# Patient Record
Sex: Female | Born: 1950 | Race: White | Hispanic: No | Marital: Married | Smoking: Never smoker
Health system: Southern US, Community
[De-identification: ages and names within clinical notes are randomized; demographics above are authoritative.]

## PROBLEM LIST (undated history)

## (undated) DIAGNOSIS — Z86718 Personal history of other venous thrombosis and embolism: Secondary | ICD-10-CM

## (undated) DIAGNOSIS — N1831 Chronic kidney disease, stage 3a: Secondary | ICD-10-CM

## (undated) DIAGNOSIS — T7840XA Allergy, unspecified, initial encounter: Secondary | ICD-10-CM

## (undated) DIAGNOSIS — E039 Hypothyroidism, unspecified: Secondary | ICD-10-CM

## (undated) DIAGNOSIS — E785 Hyperlipidemia, unspecified: Secondary | ICD-10-CM

## (undated) DIAGNOSIS — M1990 Unspecified osteoarthritis, unspecified site: Secondary | ICD-10-CM

## (undated) DIAGNOSIS — R001 Bradycardia, unspecified: Secondary | ICD-10-CM

## (undated) HISTORY — PX: CHOLECYSTECTOMY: SHX55

## (undated) HISTORY — PX: VARICOSE VEIN SURGERY: SHX832

## (undated) HISTORY — DX: Allergy, unspecified, initial encounter: T78.40XA

## (undated) HISTORY — PX: OTHER SURGICAL HISTORY: SHX169

---

## 2007-11-28 ENCOUNTER — Ambulatory Visit: Payer: Self-pay | Admitting: Unknown Physician Specialty

## 2010-08-05 ENCOUNTER — Ambulatory Visit: Payer: Self-pay | Admitting: Internal Medicine

## 2012-01-06 ENCOUNTER — Ambulatory Visit: Payer: Self-pay

## 2012-03-03 ENCOUNTER — Ambulatory Visit: Payer: Self-pay | Admitting: Internal Medicine

## 2012-03-03 LAB — CBC WITH DIFFERENTIAL/PLATELET
Basophil #: 0.1 10*3/uL (ref 0.0–0.1)
Basophil %: 1.1 %
Eosinophil #: 0.1 10*3/uL (ref 0.0–0.7)
HCT: 41 % (ref 35.0–47.0)
HGB: 13.7 g/dL (ref 12.0–16.0)
Lymphocyte #: 1.1 10*3/uL (ref 1.0–3.6)
Lymphocyte %: 16.6 %
MCHC: 33.5 g/dL (ref 32.0–36.0)
MCV: 92 fL (ref 80–100)
Neutrophil #: 4.4 10*3/uL (ref 1.4–6.5)
Platelet: 278 10*3/uL (ref 150–440)
RDW: 14 % (ref 11.5–14.5)

## 2012-03-03 LAB — URINALYSIS, COMPLETE
Blood: NEGATIVE
Glucose,UR: NEGATIVE mg/dL (ref 0–75)
Nitrite: NEGATIVE
Ph: 6.5 (ref 4.5–8.0)
Specific Gravity: 1.015 (ref 1.003–1.030)

## 2012-03-03 LAB — COMPREHENSIVE METABOLIC PANEL
BUN: 12 mg/dL (ref 7–18)
Bilirubin,Total: 0.5 mg/dL (ref 0.2–1.0)
Calcium, Total: 8.9 mg/dL (ref 8.5–10.1)
Chloride: 101 mmol/L (ref 98–107)
Co2: 30 mmol/L (ref 21–32)
Creatinine: 0.94 mg/dL (ref 0.60–1.30)
Potassium: 3.5 mmol/L (ref 3.5–5.1)
SGPT (ALT): 23 U/L (ref 12–78)
Sodium: 140 mmol/L (ref 136–145)
Total Protein: 7.9 g/dL (ref 6.4–8.2)

## 2012-03-03 LAB — SEDIMENTATION RATE: Erythrocyte Sed Rate: 29 mm/hr (ref 0–30)

## 2012-03-03 LAB — AMYLASE: Amylase: 60 U/L (ref 25–115)

## 2012-03-04 LAB — URINE CULTURE

## 2012-05-02 ENCOUNTER — Ambulatory Visit: Payer: Self-pay | Admitting: Internal Medicine

## 2012-05-05 ENCOUNTER — Other Ambulatory Visit: Payer: Self-pay | Admitting: Surgery

## 2012-05-05 LAB — CBC WITH DIFFERENTIAL/PLATELET
HCT: 37.6 % (ref 35.0–47.0)
HGB: 12.3 g/dL (ref 12.0–16.0)
Lymphocyte #: 1.2 10*3/uL (ref 1.0–3.6)
Lymphocyte %: 18.2 %
MCHC: 32.7 g/dL (ref 32.0–36.0)
Monocyte #: 0.6 x10 3/mm (ref 0.2–0.9)
Monocyte %: 9 %
Neutrophil %: 67.8 %
Platelet: 341 10*3/uL (ref 150–440)
RDW: 15.1 % — ABNORMAL HIGH (ref 11.5–14.5)
WBC: 6.6 10*3/uL (ref 3.6–11.0)

## 2012-05-24 ENCOUNTER — Ambulatory Visit: Payer: Self-pay | Admitting: Unknown Physician Specialty

## 2013-01-04 ENCOUNTER — Emergency Department: Payer: Self-pay | Admitting: Emergency Medicine

## 2013-01-04 LAB — CBC
HGB: 13.8 g/dL (ref 12.0–16.0)
RDW: 13.5 % (ref 11.5–14.5)

## 2013-01-04 LAB — BASIC METABOLIC PANEL
Calcium, Total: 8.9 mg/dL (ref 8.5–10.1)
Co2: 28 mmol/L (ref 21–32)
Creatinine: 1 mg/dL (ref 0.60–1.30)
EGFR (African American): >60

## 2013-01-04 LAB — TROPONIN I: Troponin-I: <0.02 ng/mL

## 2013-01-05 LAB — CK TOTAL AND CKMB (NOT AT ARMC): CK-MB: 0.7 ng/mL (ref 0.5–3.6)

## 2013-01-05 LAB — TROPONIN I: Troponin-I: <0.02 ng/mL

## 2013-01-12 ENCOUNTER — Ambulatory Visit: Payer: Self-pay | Admitting: Cardiology

## 2013-04-24 ENCOUNTER — Ambulatory Visit: Payer: Self-pay | Admitting: Unknown Physician Specialty

## 2013-05-04 DIAGNOSIS — N321 Vesicointestinal fistula: Secondary | ICD-10-CM | POA: Insufficient documentation

## 2013-05-04 DIAGNOSIS — K5732 Diverticulitis of large intestine without perforation or abscess without bleeding: Secondary | ICD-10-CM | POA: Insufficient documentation

## 2013-06-12 DIAGNOSIS — J90 Pleural effusion, not elsewhere classified: Secondary | ICD-10-CM | POA: Insufficient documentation

## 2013-06-12 DIAGNOSIS — IMO0002 Reserved for concepts with insufficient information to code with codable children: Secondary | ICD-10-CM | POA: Insufficient documentation

## 2013-06-12 DIAGNOSIS — K651 Peritoneal abscess: Secondary | ICD-10-CM | POA: Insufficient documentation

## 2014-02-08 ENCOUNTER — Ambulatory Visit: Payer: Self-pay | Admitting: Physician Assistant

## 2014-07-04 ENCOUNTER — Ambulatory Visit
Admit: 2014-07-04 | Disposition: A | Discharge status: Home / Self Care | Payer: Self-pay | Attending: Family Medicine | Admitting: Family Medicine

## 2014-07-11 ENCOUNTER — Other Ambulatory Visit
Admit: 2014-07-11 | Disposition: A | Discharge status: Home / Self Care | Payer: Self-pay | Attending: Unknown Physician Specialty | Admitting: Unknown Physician Specialty

## 2014-07-11 LAB — CLOSTRIDIUM DIFFICILE(ARMC)

## 2014-07-14 LAB — STOOL CULTURE

## 2014-08-04 IMAGING — US ABDOMEN ULTRASOUND LIMITED
1 series · 14 of 25 positions shown · non-contrast
Comparison: none

REASON FOR EXAM: RUQ pain  radiates to back    GALLBLADDER
COMMENTS:

[Series 1: abdomen ultrasound limited · 0.20mm/px · 14 of 76 slices shown]
[im 1/76]
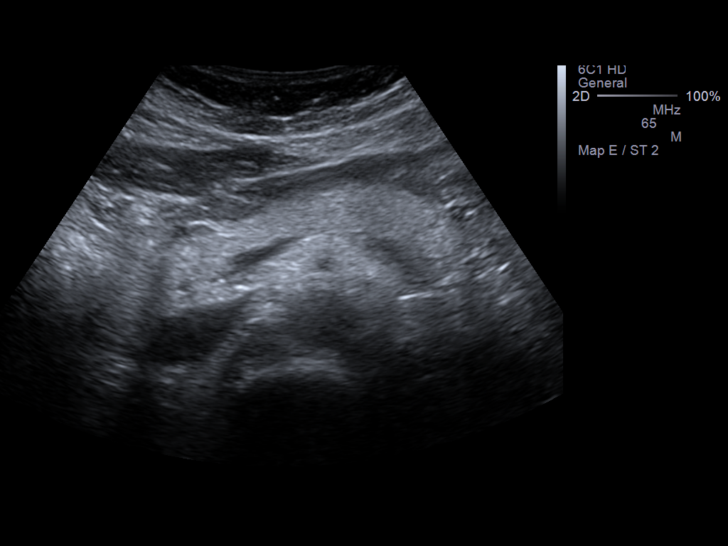
[im 7/76]
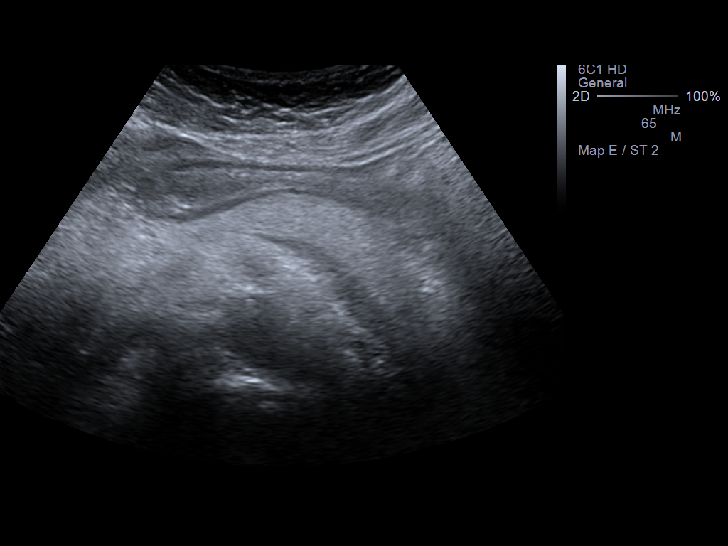
[im 13/76]
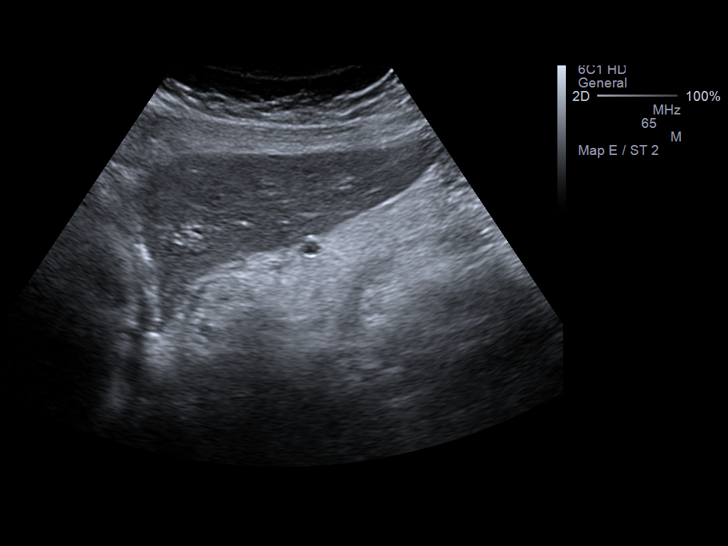
[im 19/76]
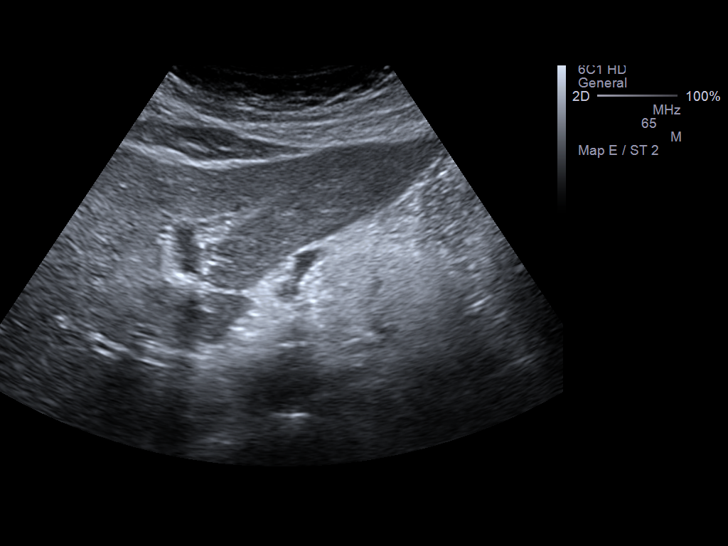
[im 26/76]
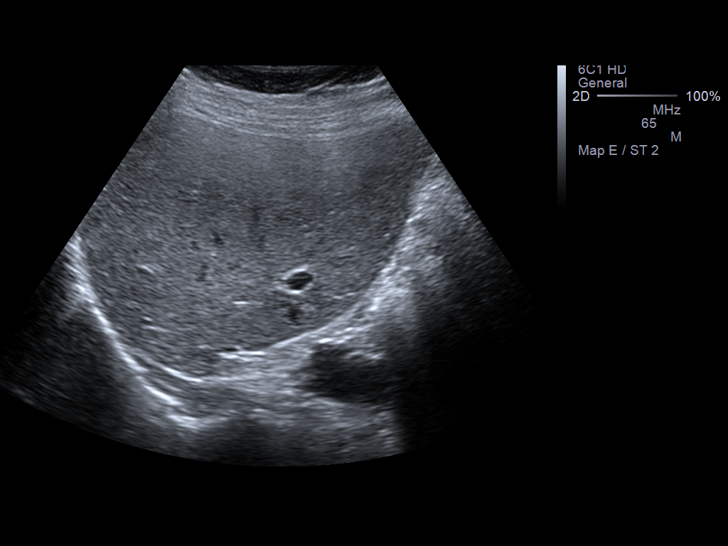
[im 29/76]
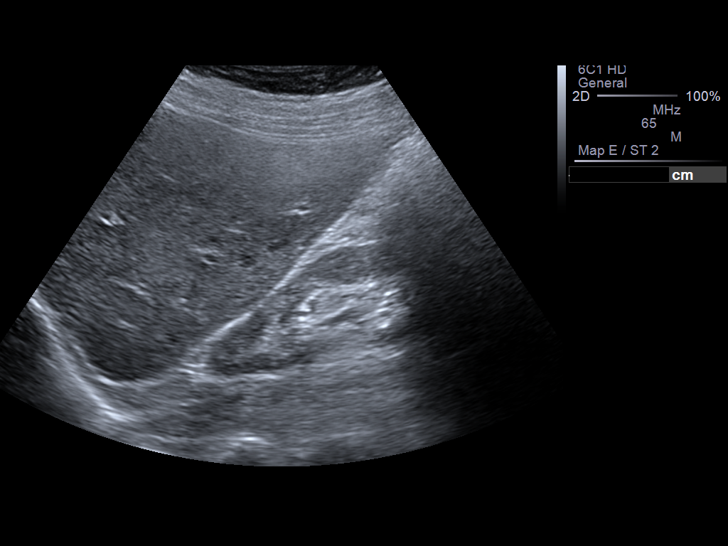
[im 35/76]
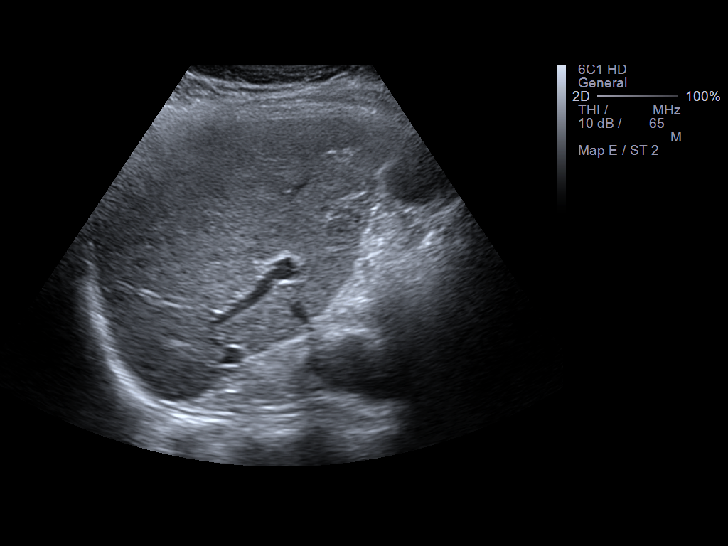
[im 41/76]
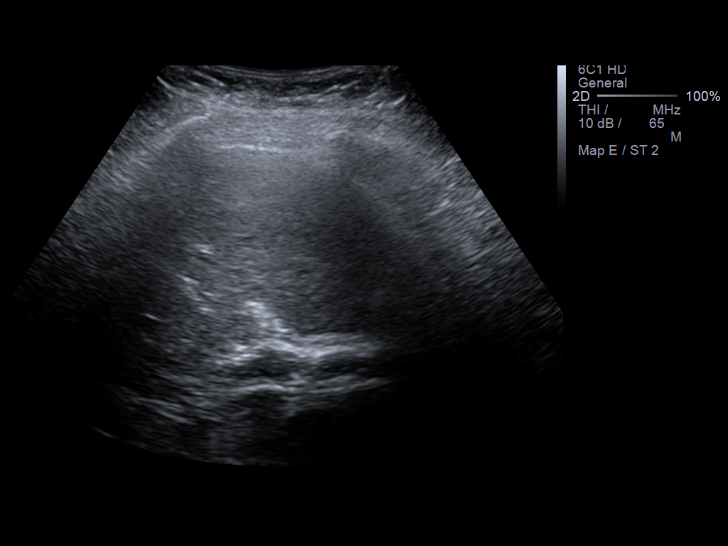
[im 47/76]
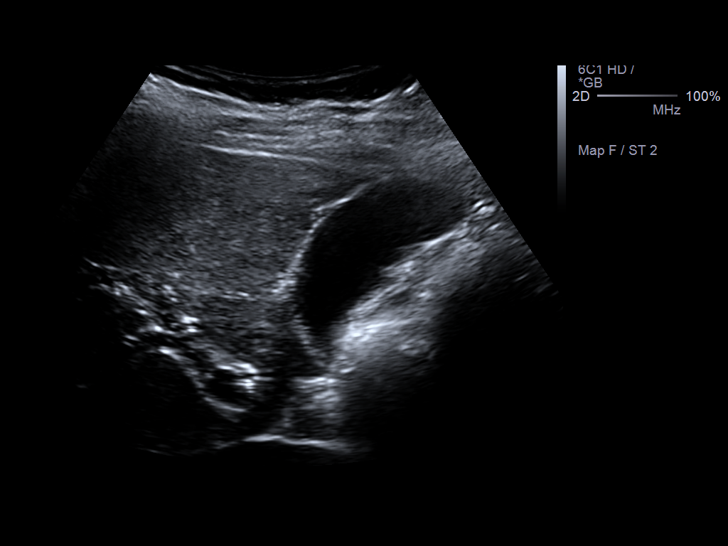
[im 51/76]
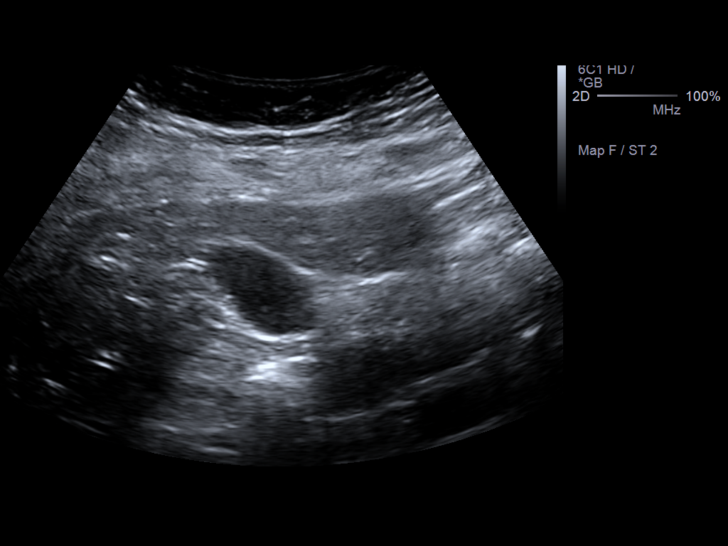
[im 57/76]
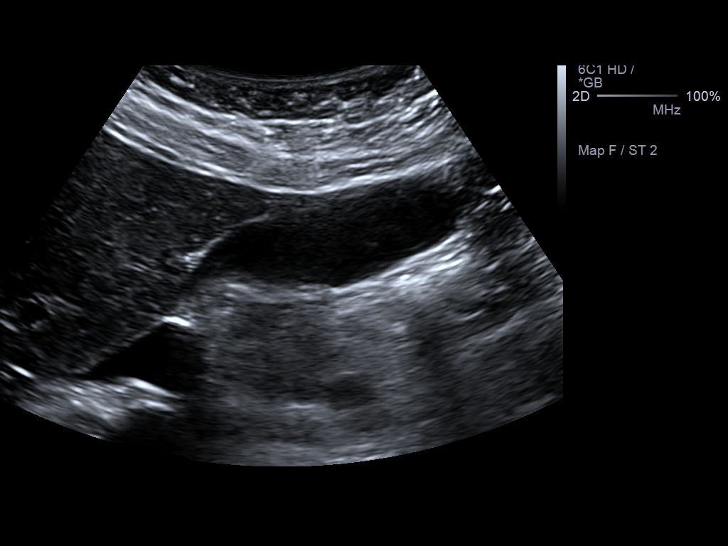
[im 63/76]
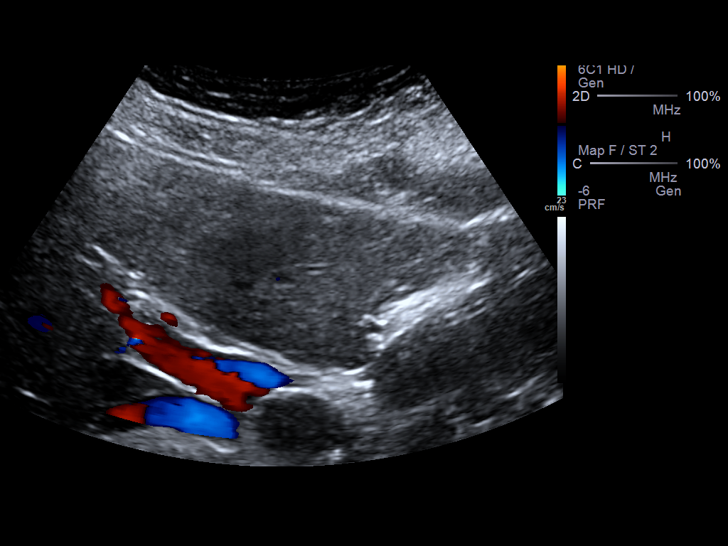
[im 69/76]
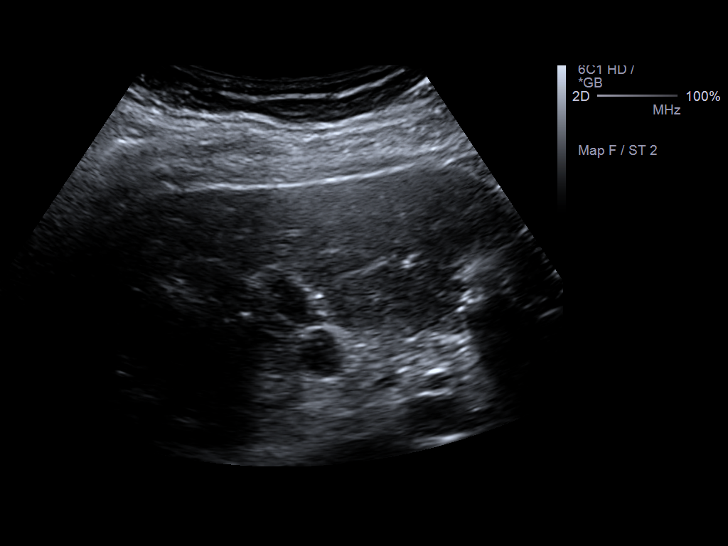
[im 76/76]
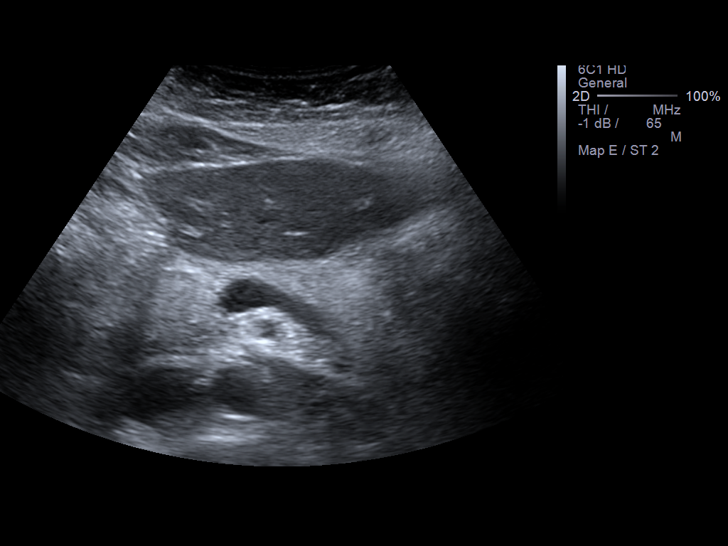

[14 of 25 positions shown; findings below may reference images not displayed]

PROCEDURE:     US  - US ABDOMEN LIMITED SURVEY  - January 12, 2013  [DATE]

RESULT:     A limited upper abdominal ultrasound examination is reviewed.

The liver exhibits normal echotexture with no focal mass nor ductal
dilation. Portal venous flow is normal in direction toward the liver. The
pancreas demonstrates normal contour and echotexture with no focal mass or
ductal dilation. The gallbladder is adequately distended with no evidence of
stones, wall thickening, or pericholecystic fluid. There is no positive
sonographic Murphy's sign. The common bile duct measures 3.5 mm in diameter.
IMPRESSION: Normal limited upper abdominal ultrasound examination.
Specifically there is no evidence of acute cholecystitis or cholelithiasis.

[REDACTED]

## 2014-09-13 ENCOUNTER — Telehealth: Payer: Self-pay

## 2014-09-13 ENCOUNTER — Ambulatory Visit (INDEPENDENT_AMBULATORY_CARE_PROVIDER_SITE_OTHER): Payer: No Typology Code available for payment source | Admitting: Family Medicine

## 2014-09-13 ENCOUNTER — Encounter (INDEPENDENT_AMBULATORY_CARE_PROVIDER_SITE_OTHER): Payer: Self-pay

## 2014-09-13 ENCOUNTER — Encounter: Payer: Self-pay | Admitting: Family Medicine

## 2014-09-13 VITALS — BP 130/80 | HR 63 | Ht 64.0 in | Wt 174.4 lb

## 2014-09-13 DIAGNOSIS — Z87898 Personal history of other specified conditions: Secondary | ICD-10-CM | POA: Insufficient documentation

## 2014-09-13 DIAGNOSIS — N904 Leukoplakia of vulva: Secondary | ICD-10-CM | POA: Diagnosis not present

## 2014-09-13 DIAGNOSIS — R700 Elevated erythrocyte sedimentation rate: Secondary | ICD-10-CM | POA: Diagnosis not present

## 2014-09-13 DIAGNOSIS — E663 Overweight: Secondary | ICD-10-CM | POA: Diagnosis not present

## 2014-09-13 DIAGNOSIS — J302 Other seasonal allergic rhinitis: Secondary | ICD-10-CM | POA: Diagnosis not present

## 2014-09-13 DIAGNOSIS — Z8719 Personal history of other diseases of the digestive system: Secondary | ICD-10-CM | POA: Insufficient documentation

## 2014-09-13 NOTE — Telephone Encounter (Signed)
-----   Message from Adline Potter, MD sent at 09/13/2014 10:56 AM EDT ----- Regarding: pt call Please inform pt I reviewed her lab work from April and before and do recommend she have a lipid panel and vit D level checked. Let me know if she agrees and I will order.

## 2014-09-13 NOTE — Progress Notes (Signed)
Date:  09/13/2014   Name:  Madeline Green   DOB:  1951/03/07   MRN:  [id number]  PCP:  Adline Potter, MD    Chief Complaint: Establish Care and Sinusitis   History of Present Illness:  This is a 64 y.o. female with hx diverticulitis with abscess last year s/p surgery at [facility], no sequela, followed by [facility]. Had negative cardiac w/u last year as well. Currently c/o sinus pressure on and off past 3 months with PND, sneezing, watery eyes. Hx "hay fever" but never lasted this long. Sxs improve with OTC antihistamine but recur off med. Nasal saline also seems to help. Hx lichen sclerosis for which she has used high potency steroids in past (unsure of name). Tetanus status unknown, thinks she may have gotten one before surgery. Had blood work done 06/2014 when dx'd with UTI (sxs resolved, not recurrent). Had one mammogram when young, not interested in another.  Review of Systems:  Review of Systems  Constitutional: Negative for unexpected weight change.  HENT: Negative for ear pain and sore throat.   Eyes: Negative for pain.  Respiratory: Negative for shortness of breath.   Cardiovascular: Negative for chest pain and leg swelling.  Gastrointestinal: Negative for abdominal pain, diarrhea, constipation, blood in stool and abdominal distention.  Genitourinary: Negative for dysuria and difficulty urinating.  Musculoskeletal: Negative for arthralgias and gait problem.  Skin: Negative for rash.  Neurological: Negative for dizziness and syncope.  Hematological: Negative for adenopathy.  Psychiatric/Behavioral: Negative for sleep disturbance.    Patient Active Problem List   Diagnosis Date Noted  . Overweight (BMI 25.0-29.9) 09/13/2014  . Seasonal allergic rhinitis 09/13/2014  . Lichen sclerosus of female genitalia 09/13/2014  . Hx of diverticulitis of colon 09/13/2014  . Hx of abdominal abscess 09/13/2014    Prior to Admission medications   Not on File    Allergies  Allergen Reactions   . Iodine Itching and Rash  . Sulfa Antibiotics Nausea Only    Past Surgical History  Procedure Laterality Date  . Cholecystectomy    . Varicose vein surgery Left     History  Substance Use Topics  . Smoking status: Never Smoker   . Smokeless tobacco: Not on file  . Alcohol Use: 0.0 oz/week    0 Standard drinks or equivalent per week    Family History  Problem Relation Age of Onset  . Alcohol abuse Mother   . Hypertension Mother   . Stroke Mother   . Varicose Veins Mother   . Alcohol abuse Father   . Heart disease Father     Medication list has been reviewed and updated.  Physical Examination: BP 130/80 mmHg  Pulse 63  Ht 5\' 4"  (1.626 m)  Wt 174 lb 6.4 oz (79.107 kg)  BMI 29.92 kg/m2  Physical Exam  Constitutional: She is oriented to person, place, and time. She appears well-developed and well-nourished. No distress.  HENT:  Head: Normocephalic and atraumatic.  Right Ear: External ear normal.  Left Ear: External ear normal.  Nose: Nose normal.  Mouth/Throat: Oropharynx is clear and moist. No oropharyngeal exudate.  Eyes: Conjunctivae and EOM are normal. Pupils are equal, round, and reactive to light. Right eye exhibits no discharge. Left eye exhibits no discharge. No scleral icterus.  Neck: Neck supple. No thyromegaly present.  Cardiovascular: Normal rate, regular rhythm, normal heart sounds and intact distal pulses.  Exam reveals no gallop.   No murmur heard. Pulmonary/Chest: Effort normal and breath sounds normal. No  respiratory distress. She has no wheezes. She has no rales.  Abdominal: Soft. She exhibits no distension and no mass. There is no tenderness.  Musculoskeletal: She exhibits no edema.  Lymphadenopathy:    She has no cervical adenopathy.  Neurological: She is alert and oriented to person, place, and time. Coordination normal.  Skin: Skin is warm and dry. No rash noted.  Psychiatric: She has a normal mood and affect. Her behavior is normal.     Assessment and Plan:  1. Overweight (BMI 25.0-29.9) Discussed increased exercise, weight loss  2. Seasonal allergic rhinitis Recommend trial of OTC second generation antihistamine, consider adding steroid NS if ineffective.  3. Lichen sclerosus of female genitalia Consider high potency steroid refill if OTC emolliants ineffective  4. Elevated sed rate On recent labs, RMSF IgM negative, no clear etiology, monitor  5. Hx of diverticulitis of colon  6. Hx of abdominal abscess With fistula s/p resection 2015  7. Health maintenance Consider lipid profile and vit D level  Return in about 6 months (around 03/15/2015).  Satira Anis. [facility]  09/13/2014

## 2014-09-13 NOTE — Telephone Encounter (Signed)
Left Pt voicemail to call me back about bloodwork

## 2014-09-13 NOTE — Telephone Encounter (Signed)
Spoke with Pt,she would like to talk to her insurance carrier before having blood work

## 2014-12-06 ENCOUNTER — Encounter: Payer: Self-pay | Admitting: Emergency Medicine

## 2014-12-06 ENCOUNTER — Ambulatory Visit
Admission: EM | Admit: 2014-12-06 | Discharge: 2014-12-06 | Disposition: A | Discharge status: Home / Self Care | Payer: No Typology Code available for payment source | Attending: Family Medicine | Admitting: Family Medicine

## 2014-12-06 DIAGNOSIS — N390 Urinary tract infection, site not specified: Secondary | ICD-10-CM | POA: Diagnosis not present

## 2014-12-06 LAB — URINALYSIS COMPLETE WITH MICROSCOPIC (ARMC ONLY)
Bilirubin Urine: NEGATIVE
Glucose, UA: NEGATIVE mg/dL
KETONES UR: NEGATIVE mg/dL
NITRITE: NEGATIVE
PH: 6.5 (ref 5.0–8.0)
PROTEIN: NEGATIVE mg/dL
SPECIFIC GRAVITY, URINE: 1.025 (ref 1.005–1.030)

## 2014-12-06 MED ORDER — CIPROFLOXACIN HCL 500 MG PO TABS: 500.0000 mg | ORAL_TABLET | Freq: Two times a day (BID) | ORAL | Status: DC

## 2014-12-06 NOTE — ED Provider Notes (Signed)
CSN: [id number]     Arrival date & time 12/06/14  1550 History   First MD Initiated Contact with Patient 12/06/14 1714     Chief Complaint  Patient presents with  . Urinary Frequency   (Consider location/radiation/quality/duration/timing/severity/associated sxs/prior Treatment) Patient is a 64 y.o. female presenting with frequency. The history is provided by the patient. No language interpreter was used.  Urinary Frequency This is a new (Started on Monday or Tuesday this week) problem. The problem occurs constantly. The problem has not changed since onset.Pertinent negatives include no chest pain, no abdominal pain, no headaches and no shortness of breath. Nothing aggravates the symptoms. She has tried nothing for the symptoms.   Patient does not smoke. And had colon surgery last year```````````````````````````````````````````````````````````````````````````````````````````````````````````````````````````````````````````````````````````````````````````````````````````````````````````````````````````````````````````````````````````````````````````````````````````````````````````````````````````````````````````````````````````````````````````````````````````````````````````````````````````````````````````````````````````````````````````````````````````````````````````````````````````````````````````````````` Past Medical History  Diagnosis Date  . Allergy    Past Surgical History  Procedure Laterality Date  . Cholecystectomy    . Varicose vein surgery Left    Family History  Problem Relation Age of Onset  . Alcohol abuse Mother   . Hypertension Mother   . Stroke Mother   . Varicose Veins Mother   . Alcohol abuse Father   . Heart disease Father   . Diabetes Father    Social History  Substance Use Topics  . Smoking status: Never Smoker   . Smokeless tobacco: None  . Alcohol Use: 0.0 oz/week    0 Standard drinks or equivalent per week   OB History    No data available     Review  of Systems  Respiratory: Negative for shortness of breath.   Cardiovascular: Negative for chest pain.  Gastrointestinal: Negative for abdominal pain.  Genitourinary: Positive for frequency. Negative for hematuria, decreased urine volume, vaginal bleeding, vaginal discharge and dyspareunia.  Neurological: Negative for headaches.  All other systems reviewed and are negative.   Allergies  Iodine and Sulfa antibiotics  Home Medications   Prior to Admission medications   Medication Sig Start Date End Date Taking? Authorizing Provider  ciprofloxacin (CIPRO) 500 MG tablet Take 1 tablet (500 mg total) by mouth 2 (two) times daily. 12/06/14   Frederich Cha, MD   Meds Ordered and Administered this Visit  Medications - No data to display  BP 141/86 mmHg  Pulse 61  Temp(Src) 98.8 F (37.1 C)  Resp 16  Ht 5\' 5"  (1.651 m)  Wt 170 lb (77.111 kg)  BMI 28.29 kg/m2  SpO2 99% No data found.   Physical Exam  Constitutional: She is oriented to person, place, and time. She appears well-developed and well-nourished.  HENT:  Head: Normocephalic and atraumatic.  Abdominal: Soft. Bowel sounds are normal. There is no hepatosplenomegaly. There is no tenderness. There is no CVA tenderness.  Musculoskeletal: Normal range of motion.  Neurological: She is alert and oriented to person, place, and time.  Skin: Skin is warm and dry.  Psychiatric: She has a normal mood and affect. Her behavior is normal.  Vitals reviewed.   ED Course  Procedures (including critical care time)  Labs Review Labs Reviewed  URINALYSIS COMPLETEWITH MICROSCOPIC (Narcissa) - Abnormal; Notable for the following:    Hgb urine dipstick TRACE (*)    Leukocytes, UA TRACE (*)    Bacteria, UA FEW (*)    Squamous Epithelial / LPF 0-5 (*)    All other components within normal limits  URINE CULTURE    Imaging Review No results found.   Visual Acuity Review  Right Eye Distance:   Left Eye Distance:   Bilateral  Distance:     Right Eye Near:   Left Eye Near:    Bilateral Near:         MDM   1. UTI (lower urinary tract infection)    patient denies the need for Pyridium. States she does get yeast infections. We'll place on Cipro 504 week 1 tablet twice a day while PCP as needed   Frederich Cha, MD 12/06/14 [phone]

## 2014-12-06 NOTE — Discharge Instructions (Signed)

## 2014-12-06 NOTE — ED Notes (Signed)
Urinary tract symptoms for 2 days

## 2014-12-08 LAB — URINE CULTURE: SPECIAL REQUESTS: NORMAL

## 2015-07-24 ENCOUNTER — Ambulatory Visit (INDEPENDENT_AMBULATORY_CARE_PROVIDER_SITE_OTHER): Payer: Medicare Other | Admitting: Family Medicine

## 2015-07-24 ENCOUNTER — Encounter: Payer: Self-pay | Admitting: Family Medicine

## 2015-07-24 VITALS — BP 106/68 | HR 72 | Temp 98.3°F | Ht 65.0 in | Wt 172.0 lb

## 2015-07-24 DIAGNOSIS — E663 Overweight: Secondary | ICD-10-CM

## 2015-07-24 DIAGNOSIS — K5732 Diverticulitis of large intestine without perforation or abscess without bleeding: Secondary | ICD-10-CM | POA: Diagnosis not present

## 2015-07-24 MED ORDER — CIPROFLOXACIN HCL 500 MG PO TABS: 500.0000 mg | ORAL_TABLET | Freq: Two times a day (BID) | ORAL | Status: DC

## 2015-07-24 MED ORDER — METRONIDAZOLE 500 MG PO TABS: 500.0000 mg | ORAL_TABLET | Freq: Two times a day (BID) | ORAL | Status: DC

## 2015-07-24 NOTE — Progress Notes (Signed)
Date:  07/24/2015   Name:  Madeline Green   DOB:  Apr 19, 1950   MRN:  [id number]  PCP:  Adline Potter, MD    Chief Complaint: Abdominal Pain   History of Present Illness:  This is a 65 y.o. female with hx diverticulitis s/p surgical drainage of abscess 2 yrs ago presents with 3d hx LLQ abdominal pain worse with eating and not improving on liquid diet.   Review of Systems:  Review of Systems  Constitutional: Negative for fever.  Respiratory: Negative for shortness of breath.   Cardiovascular: Negative for chest pain.  Gastrointestinal: Negative for nausea, vomiting, diarrhea, blood in stool and abdominal distention.  Genitourinary: Negative for dysuria.  Neurological: Negative for syncope and light-headedness.    Patient Active Problem List   Diagnosis Date Noted  . Overweight (BMI 25.0-29.9) 09/13/2014  . Seasonal allergic rhinitis 09/13/2014  . Lichen sclerosus of female genitalia 09/13/2014  . Hx of diverticulitis of colon 09/13/2014  . Hx of abdominal abscess 09/13/2014    Prior to Admission medications   Medication Sig Start Date End Date Taking? Authorizing Provider  ciprofloxacin (CIPRO) 500 MG tablet Take 1 tablet (500 mg total) by mouth 2 (two) times daily. 07/24/15   Adline Potter, MD  metroNIDAZOLE (FLAGYL) 500 MG tablet Take 1 tablet (500 mg total) by mouth 2 (two) times daily. 07/24/15   Adline Potter, MD    Allergies  Allergen Reactions  . Iodine Itching and Rash  . Sulfa Antibiotics Nausea Only    Past Surgical History  Procedure Laterality Date  . Cholecystectomy    . Varicose vein surgery Left     Social History  Substance Use Topics  . Smoking status: Never Smoker   . Smokeless tobacco: None  . Alcohol Use: 0.0 oz/week    0 Standard drinks or equivalent per week    Family History  Problem Relation Age of Onset  . Alcohol abuse Mother   . Hypertension Mother   . Stroke Mother   . Varicose Veins Mother   . Alcohol abuse Father   . Heart  disease Father   . Diabetes Father     Medication list has been reviewed and updated.  Physical Examination: BP 106/68 mmHg  Pulse 72  Temp(Src) 98.3 F (36.8 C) (Oral)  Ht 5\' 5"  (1.651 m)  Wt 172 lb (78.019 kg)  BMI 28.62 kg/m2  Physical Exam  Constitutional: She appears well-developed and well-nourished. No distress.  Cardiovascular: Normal rate, regular rhythm and normal heart sounds.   Pulmonary/Chest: Effort normal and breath sounds normal.  Abdominal: Soft. She exhibits no distension and no mass. There is no rebound and no guarding.  Minimal LLQ tenderness  Musculoskeletal: She exhibits no edema.  Neurological: She is alert.  Skin: Skin is warm and dry.  Psychiatric: She has a normal mood and affect. Her behavior is normal.  Nursing note and vitals reviewed.   Assessment and Plan:  1. Diverticulitis of large intestine without perforation or abscess without bleeding May resolve spontaneously but will cover with abxs (Cipro and Flagyl) given hx, call if sxs worsen/persist  2. Overweight (BMI 25.0-29.9) Consider lipids/vit D level next visit  Return in about 3 months (around 10/23/2015).  Satira Anis. [facility]  07/24/2015

## 2015-10-30 ENCOUNTER — Encounter: Payer: Self-pay | Admitting: Family Medicine

## 2015-10-30 ENCOUNTER — Ambulatory Visit (INDEPENDENT_AMBULATORY_CARE_PROVIDER_SITE_OTHER): Payer: Commercial Managed Care - HMO | Admitting: Family Medicine

## 2015-10-30 VITALS — BP 120/80 | HR 60 | Ht 65.0 in | Wt 173.0 lb

## 2015-10-30 DIAGNOSIS — Z0000 Encounter for general adult medical examination without abnormal findings: Secondary | ICD-10-CM | POA: Diagnosis not present

## 2015-10-30 NOTE — Progress Notes (Signed)
Patient: Madeline Green, Female    DOB: September 22, 1950, 65 y.o.   MRN: [id number] Visit Date: 10/30/2015  Today's Provider: Otilio Miu, MD   Chief Complaint  Patient presents with  . Medicare Wellness   Subjective:   Initial preventative physical exam Madeline Green is a 65 y.o. female who presents today for her Initial Preventative Physical Exam. She feels well. She reports exercising almost every day. She reports she is sleeping well.  HPI  Review of Systems  Constitutional: Negative.  Negative for chills, fatigue, fever and unexpected weight change.  HENT: Negative for congestion, ear discharge, ear pain, rhinorrhea, sinus pressure, sneezing and sore throat.   Eyes: Negative for photophobia, pain, discharge, redness and itching.  Respiratory: Negative for cough, shortness of breath, wheezing and stridor.   Gastrointestinal: Negative for abdominal pain, blood in stool, constipation, diarrhea, nausea and vomiting.  Endocrine: Negative for cold intolerance, heat intolerance, polydipsia, polyphagia and polyuria.  Genitourinary: Negative for dysuria, flank pain, frequency, hematuria, menstrual problem, pelvic pain, urgency, vaginal bleeding and vaginal discharge.  Musculoskeletal: Positive for arthralgias and myalgias. Negative for back pain.  Skin: Negative for rash.  Allergic/Immunologic: Negative for environmental allergies and food allergies.  Neurological: Negative for dizziness, weakness, light-headedness, numbness and headaches.  Hematological: Negative for adenopathy. Does not bruise/bleed easily.  Psychiatric/Behavioral: Negative for dysphoric mood. The patient is not nervous/anxious.     Social History   Social History  . Marital status: Married    Spouse name: N/A  . Number of children: N/A  . Years of education: N/A   Occupational History  . Not on file.   Social History Main Topics  . Smoking status: Never Smoker  . Smokeless tobacco: Never Used  . Alcohol use 0.0  oz/week  . Drug use: No  . Sexual activity: Yes    Partners: Male   Other Topics Concern  . Not on file   Social History Narrative  . No narrative on file    Patient Active Problem List   Diagnosis Date Noted  . Overweight (BMI 25.0-29.9) 09/13/2014  . Seasonal allergic rhinitis 09/13/2014  . Lichen sclerosus of female genitalia 09/13/2014  . Hx of diverticulitis of colon 09/13/2014  . Hx of abdominal abscess 09/13/2014    Past Surgical History:  Procedure Laterality Date  . CHOLECYSTECTOMY    . VARICOSE VEIN SURGERY Left     Her family history includes Alcohol abuse in her father and mother; Diabetes in her father; Heart disease in her father; Hypertension in her mother; Stroke in her mother; Varicose Veins in her mother.    Previous Medications   No medications on file    Patient Care Team: Adline Potter, MD as PCP - General (Family Medicine)     Objective:   Vitals: BP 120/80   Pulse 60   Ht 5\' 5"  (1.651 m)   Wt 173 lb (78.5 kg)   BMI 28.79 kg/m   Physical Exam  Constitutional: She is oriented to person, place, and time. She appears well-developed and well-nourished.  HENT:  Head: Normocephalic.  Right Ear: External ear normal.  Left Ear: External ear normal.  Mouth/Throat: Oropharynx is clear and moist.  Eyes: Conjunctivae and EOM are normal. Pupils are equal, round, and reactive to light. Lids are everted and swept, no foreign bodies found. Left eye exhibits no hordeolum. No foreign body present in the left eye. Right conjunctiva is not injected. Left conjunctiva is not injected. No scleral icterus.  Neck: Normal range  of motion. Neck supple. No JVD present. No tracheal deviation present. No thyromegaly present.  Cardiovascular: Normal rate, regular rhythm, normal heart sounds and intact distal pulses.  Exam reveals no gallop and no friction rub.   No murmur heard. Pulmonary/Chest: Effort normal and breath sounds normal. No respiratory distress. She has  no wheezes. She has no rales.  Abdominal: Soft. Bowel sounds are normal. She exhibits no mass. There is no hepatosplenomegaly. There is no tenderness. There is no rebound and no guarding.  Musculoskeletal: Normal range of motion. She exhibits no edema or tenderness.  Lymphadenopathy:    She has no cervical adenopathy.  Neurological: She is alert and oriented to person, place, and time. She has normal strength. She displays normal reflexes. No cranial nerve deficit.  Skin: Skin is warm. No rash noted.  Psychiatric: She has a normal mood and affect. Her mood appears not anxious. She does not exhibit a depressed mood.  Nursing note and vitals reviewed.    No exam data present  Activities of Daily Living No flowsheet data found.  Fall Risk Assessment Fall Risk  10/30/2015 09/13/2014  Falls in the past year? No No     Patient reports there are safety devices in place in shower at home.   Depression Screen PHQ 2/9 Scores 10/30/2015 09/13/2014  PHQ - 2 Score 0 0    Cognitive Testing - 6-CIT   Correct? Score   What year is it? yes 0 Yes = 0    No = 4  What month is it? yes 0 Yes = 0    No = 3  Remember:     [address]ia [address], [place], [place]     What time is it? yes 0 Yes = 0    No = 3  Count backwards from 20 to 1 yes 0 Correct = 0    1 error = 2   More than 1 error = 4  Say the months of the year in reverse. yes 0 Correct = 0    1 error = 2   More than 1 error = 4  What address did I ask you to remember? yes 0 Correct = 0  1 error = 2    2 error = 4    3 error = 6    4 error = 8    All wrong = 10       TOTAL SCORE  0/28   Interpretation:  Normal  Normal (0-7) Abnormal (8-28)     Assessment & Plan:     Initial Preventative Physical Exam  Reviewed patient's Family Medical History Reviewed and updated list of patient's medical providers Assessment of cognitive impairment was done Assessed patient's functional ability Established a written schedule for health screening  [place] Completed and Reviewed  Exercise Activities and Dietary recommendations Goals    None       There is no immunization history on file for this patient.  Health Maintenance  Topic Date Due  . Hepatitis C Screening  Nov 04, 1950  . HIV Screening  05/28/1965  . PNA vac Low Risk Adult (2 of 2 - PPSV23) 10/29/2016  . MAMMOGRAM  10/29/2017  . PAP SMEAR  10/30/2018  . COLONOSCOPY  03/30/2023  . TETANUS/TDAP  10/29/2025  . INFLUENZA VACCINE  Addressed  . DEXA SCAN  Addressed  . ZOSTAVAX  Addressed      Discussed health benefits of physical activity, and encouraged her to engage in  regular exercise appropriate for her age and condition.    ------------------------------------------------------------------------------------------------------------   Problem List Items Addressed This Visit    None    Visit Diagnoses    Encounter for Medicare annual wellness exam    -  Primary       Otilio Miu, MD [facility]  10/30/2015

## 2016-01-01 ENCOUNTER — Ambulatory Visit: Payer: Self-pay | Admitting: Family Medicine

## 2016-02-09 ENCOUNTER — Ambulatory Visit (INDEPENDENT_AMBULATORY_CARE_PROVIDER_SITE_OTHER): Payer: Commercial Managed Care - HMO | Admitting: Family Medicine

## 2016-02-09 ENCOUNTER — Encounter: Payer: Self-pay | Admitting: Family Medicine

## 2016-02-09 VITALS — BP 128/80 | HR 78 | Ht 65.0 in | Wt 177.0 lb

## 2016-02-09 DIAGNOSIS — N390 Urinary tract infection, site not specified: Secondary | ICD-10-CM | POA: Diagnosis not present

## 2016-02-09 DIAGNOSIS — R339 Retention of urine, unspecified: Secondary | ICD-10-CM | POA: Diagnosis not present

## 2016-02-09 LAB — POCT URINALYSIS DIPSTICK
BILIRUBIN UA: NEGATIVE
GLUCOSE UA: NEGATIVE
Ketones, UA: NEGATIVE
LEUKOCYTES UA: NEGATIVE
NITRITE UA: NEGATIVE
Protein, UA: NEGATIVE
Spec Grav, UA: 1.01
Urobilinogen, UA: 0.2
pH, UA: 6

## 2016-02-09 MED ORDER — SULFAMETHOXAZOLE-TRIMETHOPRIM 800-160 MG PO TABS: 1.0000 | ORAL_TABLET | Freq: Two times a day (BID) | ORAL | 0 refills | Status: DC

## 2016-02-09 NOTE — Progress Notes (Signed)
Name: Madeline Green   MRN: [id number]    DOB: [birth year]   Date:02/09/2016       Progress Note  Subjective  Chief Complaint  Chief Complaint  Patient presents with  . Urinary Tract Infection    was treated in August for UTI- frequent urination, pressure at beginning and after urination    Urinary Tract Infection   This is a new problem. The current episode started in the past 7 days. The problem occurs intermittently. The problem has been waxing and waning. Quality: pressure. The pain is at a severity of 1/10. The pain is mild. There has been no fever. Associated symptoms include frequency and urgency. Pertinent negatives include no chills, discharge, flank pain, hematuria, hesitancy, nausea, sweats or vomiting. Associated symptoms comments: Retention/ incomplete emptying. She has tried nothing for the symptoms. The treatment provided mild relief.    No problem-specific Assessment & Plan notes found for this encounter.   Past Medical History:  Diagnosis Date  . Allergy     Past Surgical History:  Procedure Laterality Date  . CHOLECYSTECTOMY    . VARICOSE VEIN SURGERY Left     Family History  Problem Relation Age of Onset  . Alcohol abuse Mother   . Hypertension Mother   . Stroke Mother   . Varicose Veins Mother   . Alcohol abuse Father   . Heart disease Father   . Diabetes Father     Social History   Social History  . Marital status: Married    Spouse name: N/A  . Number of children: N/A  . Years of education: N/A   Occupational History  . Not on file.   Social History Main Topics  . Smoking status: Never Smoker  . Smokeless tobacco: Never Used  . Alcohol use 0.0 oz/week  . Drug use: No  . Sexual activity: Yes    Partners: Male   Other Topics Concern  . Not on file   Social History Narrative  . No narrative on file    Allergies  Allergen Reactions  . Iodine Itching and Rash     Review of Systems  Constitutional: Negative for chills, fever,  malaise/fatigue and weight loss.  HENT: Negative for ear discharge, ear pain and sore throat.   Eyes: Negative for blurred vision.  Respiratory: Negative for cough, sputum production, shortness of breath and wheezing.   Cardiovascular: Negative for chest pain, palpitations and leg swelling.  Gastrointestinal: Negative for abdominal pain, blood in stool, constipation, diarrhea, heartburn, melena, nausea and vomiting.  Genitourinary: Positive for frequency and urgency. Negative for dysuria, flank pain, hematuria and hesitancy.       Mild incontinence with sneeze/laugh  Musculoskeletal: Negative for back pain, joint pain, myalgias and neck pain.  Skin: Negative for rash.  Neurological: Negative for dizziness, tingling, sensory change, focal weakness and headaches.  Endo/Heme/Allergies: Negative for environmental allergies and polydipsia. Does not bruise/bleed easily.  Psychiatric/Behavioral: Negative for depression and suicidal ideas. The patient is not nervous/anxious and does not have insomnia.      Objective  Vitals:   02/09/16 1535  BP: 128/80  Pulse: 78  Weight: 177 lb (80.3 kg)  Height: 5\' 5"  (1.651 m)    Physical Exam  Constitutional: She is well-developed, well-nourished, and in no distress. No distress.  HENT:  Head: Normocephalic and atraumatic.  Right Ear: External ear normal.  Left Ear: External ear normal.  Nose: Nose normal.  Mouth/Throat: Oropharynx is clear and moist.  Eyes: Conjunctivae and EOM  are normal. Pupils are equal, round, and reactive to light. Right eye exhibits no discharge. Left eye exhibits no discharge.  Neck: Normal range of motion. Neck supple. No JVD present. No thyromegaly present.  Cardiovascular: Normal rate, regular rhythm, normal heart sounds and intact distal pulses.  Exam reveals no gallop and no friction rub.   No murmur heard. Pulmonary/Chest: Effort normal and breath sounds normal. She has no wheezes. She has no rales.  Abdominal: Soft.  Bowel sounds are normal. She exhibits no mass. There is no hepatosplenomegaly. There is no tenderness. There is no rebound, no guarding and no CVA tenderness.  Musculoskeletal: Normal range of motion. She exhibits no edema.  Lymphadenopathy:    She has no cervical adenopathy.  Neurological: She is alert. She has normal reflexes.  Skin: Skin is warm and dry. She is not diaphoretic.  Psychiatric: Mood and affect normal.  Nursing note and vitals reviewed.     Assessment & Plan  Problem List Items Addressed This Visit    None    Visit Diagnoses    Recurrent UTI    -  Primary   Relevant Medications   sulfamethoxazole-trimethoprim (BACTRIM DS,SEPTRA DS) 800-160 MG tablet   Other Relevant Orders   POCT Urinalysis Dipstick (Completed)   Urine Culture   Urinary retention       Relevant Orders   Ambulatory referral to Urology   Urine Culture        Dr. Otilio Miu [facility]  02/09/16

## 2016-02-11 LAB — URINE CULTURE

## 2017-01-19 ENCOUNTER — Encounter: Payer: Self-pay | Admitting: Family Medicine

## 2017-01-19 ENCOUNTER — Telehealth: Payer: No Typology Code available for payment source | Admitting: Nurse Practitioner

## 2017-01-19 ENCOUNTER — Telehealth: Payer: Self-pay

## 2017-01-19 DIAGNOSIS — N390 Urinary tract infection, site not specified: Secondary | ICD-10-CM

## 2017-01-19 DIAGNOSIS — N3000 Acute cystitis without hematuria: Secondary | ICD-10-CM

## 2017-01-19 MED ORDER — SULFAMETHOXAZOLE-TRIMETHOPRIM 800-160 MG PO TABS: 1.0000 | ORAL_TABLET | Freq: Two times a day (BID) | ORAL | 0 refills | Status: DC

## 2017-01-19 NOTE — Telephone Encounter (Signed)
Agree 

## 2017-01-19 NOTE — Telephone Encounter (Signed)
Pt sent in e-mail requesting an antibiotic- pt has not been seen in a year. No antibiotic will be called in unless seen first. Pt is aware of this as well as she is to see Dr Vicente Masson if she needs an appt. She stated that she would "see if the e-visit works out for her first."

## 2017-01-19 NOTE — Progress Notes (Signed)
We are sorry that you are not feeling well.  Here is how we plan to help!  Based on what you shared with me it looks like you most likely have a simple urinary tract infection.  A UTI (Urinary Tract Infection) is a bacterial infection of the bladder.  Most cases of urinary tract infections are simple to treat but a key part of your care is to encourage you to drink plenty of fluids and watch your symptoms carefully.  I have prescribed bactrim ds twice a day for 7 days.  Your symptoms should gradually improve. Call us if the burning in your urine worsens, you develop worsening fever, back pain or pelvic pain or if your symptoms do not resolve after completing the antibiotic.  Urinary tract infections can be prevented by drinking plenty of water to keep your body hydrated.  Also be sure when you wipe, wipe from front to back and don't hold it in!  If possible, empty your bladder every 4 hours.  Your e-visit answers were reviewed by a board certified advanced clinical practitioner to complete your personal care plan.  Depending on the condition, your plan could have included both over the counter or prescription medications.  If there is a problem please reply  once you have received a response from your provider.  Your safety is important to [place].  If you have drug allergies check your prescription carefully.    You can use MyChart to ask questions about today's visit, request a non-urgent call back, or ask for a work or school excuse for 24 hours related to this e-Visit. If it has been greater than 24 hours you will need to follow up with your provider, or enter a new e-Visit to address those concerns.   You will get an e-mail in the next two days asking about your experience.  I hope that your e-visit has been valuable and will speed your recovery. Thank you for using e-visits.

## 2017-01-20 MED ORDER — CEPHALEXIN 500 MG PO CAPS: 500.0000 mg | ORAL_CAPSULE | Freq: Two times a day (BID) | ORAL | 0 refills | Status: DC

## 2017-01-20 NOTE — Progress Notes (Signed)
Patient bactrim rx was not sent in correctly- was only given enough for 5 days and should have been done for 7 days. Also bactrim is not best treatment per protocol , so prescription was changed to keflex 500mg  bid fr 7 days. Attempted to contact patient. Left message on voice mail on home phone and cell phone as to change of medication, left my office number for her to call if she ad any questions. I will also send her a reply in her my chart.

## 2017-01-20 NOTE — Addendum Note (Signed)
Addended by: Chevis Pretty on: 01/20/2017 11:16 AM   Modules accepted: Orders

## 2018-04-29 ENCOUNTER — Emergency Department
Admission: EM | Admit: 2018-04-29 | Discharge: 2018-04-29 | Disposition: A | Discharge status: Home / Self Care | Payer: Medicare HMO | Attending: Emergency Medicine | Admitting: Emergency Medicine

## 2018-04-29 ENCOUNTER — Encounter: Payer: Self-pay | Admitting: Emergency Medicine

## 2018-04-29 ENCOUNTER — Emergency Department: Payer: Medicare HMO

## 2018-04-29 ENCOUNTER — Other Ambulatory Visit: Payer: Self-pay

## 2018-04-29 DIAGNOSIS — I8001 Phlebitis and thrombophlebitis of superficial vessels of right lower extremity: Secondary | ICD-10-CM | POA: Insufficient documentation

## 2018-04-29 DIAGNOSIS — M79604 Pain in right leg: Secondary | ICD-10-CM | POA: Diagnosis not present

## 2018-04-29 DIAGNOSIS — M7989 Other specified soft tissue disorders: Secondary | ICD-10-CM | POA: Diagnosis not present

## 2018-04-29 DIAGNOSIS — Z79899 Other long term (current) drug therapy: Secondary | ICD-10-CM | POA: Diagnosis not present

## 2018-04-29 NOTE — ED Provider Notes (Signed)
[facility] Emergency Department Provider Note  Time seen: 4:26 PM  I have reviewed the triage vital signs and the nursing notes.   HISTORY  Chief Complaint Leg Pain    HPI Madeline HITSMAN is a 68 y.o. female with no significant past medical history presents to the emergency department for right leg pain.  According to the patient for the past 2 days she has noticed tenderness just above the back of her right knee.  Denies any redness or swelling to the leg.  Patient was concerned over possible blood clot so she came to the emergency department for evaluation.  Denies any fever, chest pain or shortness of breath.  Largely negative review of systems.   Past Medical History:  Diagnosis Date  . Allergy     Patient Active Problem List   Diagnosis Date Noted  . Overweight (BMI 25.0-29.9) 09/13/2014  . Seasonal allergic rhinitis 09/13/2014  . Lichen sclerosus of female genitalia 09/13/2014  . Hx of diverticulitis of colon 09/13/2014  . Hx of abdominal abscess 09/13/2014    Past Surgical History:  Procedure Laterality Date  . CHOLECYSTECTOMY    . VARICOSE VEIN SURGERY Left     Prior to Admission medications   Medication Sig Start Date End Date Taking? Authorizing Provider  cephALEXin (KEFLEX) 500 MG capsule Take 1 capsule (500 mg total) by mouth 2 (two) times daily. 01/20/17   Chevis Pretty, FNP    Allergies  Allergen Reactions  . Iodine Itching and Rash    Family History  Problem Relation Age of Onset  . Alcohol abuse Mother   . Hypertension Mother   . Stroke Mother   . Varicose Veins Mother   . Alcohol abuse Father   . Heart disease Father   . Diabetes Father     Social History Social History   Tobacco Use  . Smoking status: Never Smoker  . Smokeless tobacco: Never Used  Substance Use Topics  . Alcohol use: Yes    Alcohol/week: 0.0 standard drinks  . Drug use: No    Review of Systems Constitutional: Negative for  fever. Cardiovascular: Negative for chest pain. Respiratory: Negative for shortness of breath. Gastrointestinal: Negative for abdominal pain Genitourinary: Negative for urinary compaints Musculoskeletal: Tenderness just above her right knee to the posterior leg. Skin: Negative for skin complaints All other ROS negative  ____________________________________________   PHYSICAL EXAM:  VITAL SIGNS: ED Triage Vitals  Enc Vitals Group     BP 04/29/18 1345 (!) 158/79     Pulse Rate 04/29/18 1345 60     Resp 04/29/18 1345 16     Temp 04/29/18 1345 98.1 F (36.7 C)     Temp Source 04/29/18 1345 Oral     SpO2 04/29/18 1618 97 %     Weight 04/29/18 1345 170 lb (77.1 kg)     Height 04/29/18 1345 5\' 5"  (1.651 m)     Head Circumference --      Peak Flow --      Pain Score 04/29/18 1345 0     Pain Loc --      Pain Edu? --      Excl. in [place]? --    Constitutional: Alert and oriented. Well appearing and in no distress. Eyes: Normal exam ENT   Head: Normocephalic and atraumatic.   Mouth/Throat: Mucous membranes are moist. Cardiovascular: Normal rate, regular rhythm. No murmur Respiratory: Normal respiratory effort without tachypnea nor retractions. Breath sounds are clear Gastrointestinal: Soft and  nontender. No distention.   Musculoskeletal: Very slight tenderness above the right popliteal fossa.  No erythema noted.  No lower extremity edema.  No calf tenderness or swelling. Neurologic:  Normal speech and language. No gross focal neurologic deficits  Skin:  Skin is warm, dry and intact.  Psychiatric: Mood and affect are normal.  ____________________________________________   RADIOLOGY  Ultrasound shows superficial thrombophlebitis, negative for DVT.  ____________________________________________   INITIAL IMPRESSION / ASSESSMENT AND PLAN / ED COURSE  Pertinent labs & imaging results that were available during my care of the patient were reviewed by me and considered in my  medical decision making (see chart for details).  Ultrasound most consistent with superficial thrombophlebitis but negative for DVT.  Overall the patient appears very well.  I recommended the patient start 81 mg aspirin daily for the next 2 weeks as well as using warm compresses for 20 to 30 minutes every 2-3 hours.  She will follow-up with her PCP I discussed return precautions for any increased pain, development of redness or swelling to the leg or any chest pain or shortness of breath.  ____________________________________________   FINAL CLINICAL IMPRESSION(S) / ED DIAGNOSES  Superficial thrombophlebitis   Harvest Dark, MD 04/29/18 [phone]

## 2018-04-29 NOTE — Discharge Instructions (Addendum)
As we discussed please take a 81 mg aspirin each day for the next 2 weeks.  Use a warm compress to the area for 20 to 30 minutes every 2-3 hours as needed for discomfort.  Please follow-up with your primary care doctor regarding today's ER visit and your diagnosis.  Return to the emergency department for any increased pain or swelling of the leg, any chest pain or shortness of breath.

## 2018-04-29 NOTE — ED Triage Notes (Signed)
Pt to ED via POV for right leg pain. Pt states that the pain started yesterday when she was getting out of the car. Pt has a knot above the right posterior knee. Pt states that the area is tender to touch. No redness or swelling noted. Pt is in NAD.

## 2018-08-11 ENCOUNTER — Telehealth: Payer: Medicare HMO | Admitting: Family

## 2018-08-11 DIAGNOSIS — N390 Urinary tract infection, site not specified: Secondary | ICD-10-CM | POA: Diagnosis not present

## 2018-08-11 MED ORDER — CEPHALEXIN 500 MG PO CAPS: 500.0000 mg | ORAL_CAPSULE | Freq: Two times a day (BID) | ORAL | 0 refills | Status: DC

## 2018-08-11 NOTE — Progress Notes (Signed)
Greater than 5 minutes, yet less than 10 minutes of time have been spent researching, coordinating, and implementing care for this patient today.  Thank you for the details you included in the comment boxes. Those details are very helpful in determining the best course of treatment for you and help us to provide the best care.  We are sorry that you are not feeling well.  Here is how we plan to help!  Based on what you shared with me it looks like you most likely have a simple urinary tract infection.  A UTI (Urinary Tract Infection) is a bacterial infection of the bladder.  Most cases of urinary tract infections are simple to treat but a key part of your care is to encourage you to drink plenty of fluids and watch your symptoms carefully.  I have prescribed Keflex 500 mg twice a day for 7 days.  Your symptoms should gradually improve. Call us if the burning in your urine worsens, you develop worsening fever, back pain or pelvic pain or if your symptoms do not resolve after completing the antibiotic.  Urinary tract infections can be prevented by drinking plenty of water to keep your body hydrated.  Also be sure when you wipe, wipe from front to back and don't hold it in!  If possible, empty your bladder every 4 hours.  Your e-visit answers were reviewed by a board certified advanced clinical practitioner to complete your personal care plan.  Depending on the condition, your plan could have included both over the counter or prescription medications.  If there is a problem please reply  once you have received a response from your provider.  Your safety is important to us.  If you have drug allergies check your prescription carefully.    You can use MyChart to ask questions about today's visit, request a non-urgent call back, or ask for a work or school excuse for 24 hours related to this e-Visit. If it has been greater than 24 hours you will need to follow up with your provider, or enter a new  e-Visit to address those concerns.   You will get an e-mail in the next two days asking about your experience.  I hope that your e-visit has been valuable and will speed your recovery. Thank you for using e-visits.    

## 2018-10-09 DIAGNOSIS — L237 Allergic contact dermatitis due to plants, except food: Secondary | ICD-10-CM | POA: Diagnosis not present

## 2019-01-25 ENCOUNTER — Other Ambulatory Visit (HOSPITAL_COMMUNITY): Payer: Self-pay | Admitting: Internal Medicine

## 2019-01-25 ENCOUNTER — Ambulatory Visit
Admission: RE | Admit: 2019-01-25 | Discharge: 2019-01-25 | Disposition: A | Discharge status: Home / Self Care | Payer: Medicare HMO | Source: Ambulatory Visit | Attending: Internal Medicine | Admitting: Internal Medicine

## 2019-01-25 ENCOUNTER — Other Ambulatory Visit: Payer: Self-pay

## 2019-01-25 ENCOUNTER — Other Ambulatory Visit: Payer: Self-pay | Admitting: Internal Medicine

## 2019-01-25 ENCOUNTER — Ambulatory Visit: Admission: RE | Admit: 2019-01-25 | Payer: Medicare HMO | Source: Ambulatory Visit

## 2019-01-25 DIAGNOSIS — Z0000 Encounter for general adult medical examination without abnormal findings: Secondary | ICD-10-CM | POA: Insufficient documentation

## 2019-01-25 DIAGNOSIS — Z1331 Encounter for screening for depression: Secondary | ICD-10-CM | POA: Diagnosis not present

## 2019-01-25 DIAGNOSIS — R0602 Shortness of breath: Secondary | ICD-10-CM | POA: Diagnosis not present

## 2019-01-25 DIAGNOSIS — R002 Palpitations: Secondary | ICD-10-CM | POA: Diagnosis not present

## 2019-01-25 LAB — POCT I-STAT CREATININE: Creatinine, Ser: 1 mg/dL (ref 0.44–1.00)

## 2019-01-25 MED ORDER — IOHEXOL 350 MG/ML SOLN
75.0000 mL | Freq: Once | INTRAVENOUS | Status: AC | PRN
  Administered 2019-01-25: 75 mL via INTRAVENOUS

## 2019-02-02 DIAGNOSIS — R0602 Shortness of breath: Secondary | ICD-10-CM | POA: Diagnosis not present

## 2019-02-02 DIAGNOSIS — I499 Cardiac arrhythmia, unspecified: Secondary | ICD-10-CM | POA: Diagnosis not present

## 2019-02-02 DIAGNOSIS — R002 Palpitations: Secondary | ICD-10-CM | POA: Diagnosis not present

## 2019-02-02 DIAGNOSIS — E663 Overweight: Secondary | ICD-10-CM | POA: Diagnosis not present

## 2019-02-08 DIAGNOSIS — E038 Other specified hypothyroidism: Secondary | ICD-10-CM | POA: Diagnosis not present

## 2019-02-08 DIAGNOSIS — R030 Elevated blood-pressure reading, without diagnosis of hypertension: Secondary | ICD-10-CM | POA: Diagnosis not present

## 2019-02-08 HISTORY — DX: Elevated blood-pressure reading, without diagnosis of hypertension: R03.0

## 2019-02-16 DIAGNOSIS — I499 Cardiac arrhythmia, unspecified: Secondary | ICD-10-CM | POA: Diagnosis not present

## 2019-02-16 DIAGNOSIS — R0602 Shortness of breath: Secondary | ICD-10-CM | POA: Diagnosis not present

## 2019-02-28 DIAGNOSIS — I499 Cardiac arrhythmia, unspecified: Secondary | ICD-10-CM | POA: Diagnosis not present

## 2019-03-09 DIAGNOSIS — E039 Hypothyroidism, unspecified: Secondary | ICD-10-CM | POA: Diagnosis not present

## 2019-06-08 DIAGNOSIS — E039 Hypothyroidism, unspecified: Secondary | ICD-10-CM | POA: Diagnosis not present

## 2019-06-08 DIAGNOSIS — Z79899 Other long term (current) drug therapy: Secondary | ICD-10-CM | POA: Diagnosis not present

## 2019-06-08 DIAGNOSIS — R030 Elevated blood-pressure reading, without diagnosis of hypertension: Secondary | ICD-10-CM | POA: Diagnosis not present

## 2019-06-28 ENCOUNTER — Ambulatory Visit: Payer: Medicare HMO | Attending: Internal Medicine

## 2019-06-28 DIAGNOSIS — Z23 Encounter for immunization: Secondary | ICD-10-CM

## 2019-06-28 DIAGNOSIS — R001 Bradycardia, unspecified: Secondary | ICD-10-CM | POA: Insufficient documentation

## 2019-06-28 NOTE — Progress Notes (Signed)
   Covid-19 Vaccination Clinic  Name:  CHAKARA IRUEGAS    MRN: [id number] DOB: [birth year]  06/28/2019  Ms. Hollandsworth was observed post Covid-19 immunization for 15 minutes without incident. She was provided with Vaccine Information Sheet and instruction to access the V-Safe system.   Ms. Keeffe was instructed to call 911 with any severe reactions post vaccine: Marland Kitchen Difficulty breathing  . Swelling of face and throat  . A fast heartbeat  . A bad rash all over body  . Dizziness and weakness   Immunizations Administered    Name Date Dose VIS Date Route   Pfizer COVID-19 Vaccine 06/28/2019 10:01 AM 0.3 mL 03/09/2019 Intramuscular   Manufacturer: [place]   Lot: [id number]   [place]: [id number]

## 2019-07-20 ENCOUNTER — Ambulatory Visit: Payer: Medicare HMO | Attending: Internal Medicine

## 2019-07-20 ENCOUNTER — Ambulatory Visit: Payer: Medicare HMO

## 2019-07-20 DIAGNOSIS — Z23 Encounter for immunization: Secondary | ICD-10-CM

## 2019-07-20 NOTE — Progress Notes (Signed)
   Covid-19 Vaccination Clinic  Name:  Madeline Green    MRN: [id number] DOB: 1951/03/09  07/20/2019  Madeline Green was observed post Covid-19 immunization for 15 minutes without incident. She was provided with Vaccine Information Sheet and instruction to access the V-Safe system.   Madeline Green was instructed to call 911 with any severe reactions post vaccine: Marland Kitchen Difficulty breathing  . Swelling of face and throat  . A fast heartbeat  . A bad rash all over body  . Dizziness and weakness   Immunizations Administered    Name Date Dose VIS Date Route   Pfizer COVID-19 Vaccine 07/20/2019 10:53 AM 0.3 mL 05/23/2018 Intramuscular   Manufacturer: [place]   Lot: [id number]   [place]: [id number]

## 2019-07-24 ENCOUNTER — Ambulatory Visit: Payer: Medicare HMO

## 2019-09-06 DIAGNOSIS — R030 Elevated blood-pressure reading, without diagnosis of hypertension: Secondary | ICD-10-CM | POA: Diagnosis not present

## 2019-09-06 DIAGNOSIS — E039 Hypothyroidism, unspecified: Secondary | ICD-10-CM | POA: Diagnosis not present

## 2019-09-13 DIAGNOSIS — R030 Elevated blood-pressure reading, without diagnosis of hypertension: Secondary | ICD-10-CM | POA: Diagnosis not present

## 2019-09-13 DIAGNOSIS — Z79899 Other long term (current) drug therapy: Secondary | ICD-10-CM | POA: Diagnosis not present

## 2019-09-13 DIAGNOSIS — E039 Hypothyroidism, unspecified: Secondary | ICD-10-CM | POA: Diagnosis not present

## 2019-09-13 DIAGNOSIS — R001 Bradycardia, unspecified: Secondary | ICD-10-CM | POA: Diagnosis not present

## 2019-09-27 DIAGNOSIS — I499 Cardiac arrhythmia, unspecified: Secondary | ICD-10-CM | POA: Diagnosis not present

## 2019-09-27 DIAGNOSIS — R002 Palpitations: Secondary | ICD-10-CM | POA: Diagnosis not present

## 2019-10-08 DIAGNOSIS — R001 Bradycardia, unspecified: Secondary | ICD-10-CM | POA: Diagnosis not present

## 2019-10-09 DIAGNOSIS — K5909 Other constipation: Secondary | ICD-10-CM | POA: Diagnosis not present

## 2019-10-09 DIAGNOSIS — N390 Urinary tract infection, site not specified: Secondary | ICD-10-CM | POA: Diagnosis not present

## 2019-11-19 IMAGING — US US EXTREM LOW VENOUS*R*
1 series · 13 of 24 positions shown · non-contrast
Comparison: None.

CLINICAL DATA: Right leg pain, knot on posterior right leg.



[Series 1: us extrem low venous*right* · 40 acquisitions, 13 frames shown]
[im 1/40]
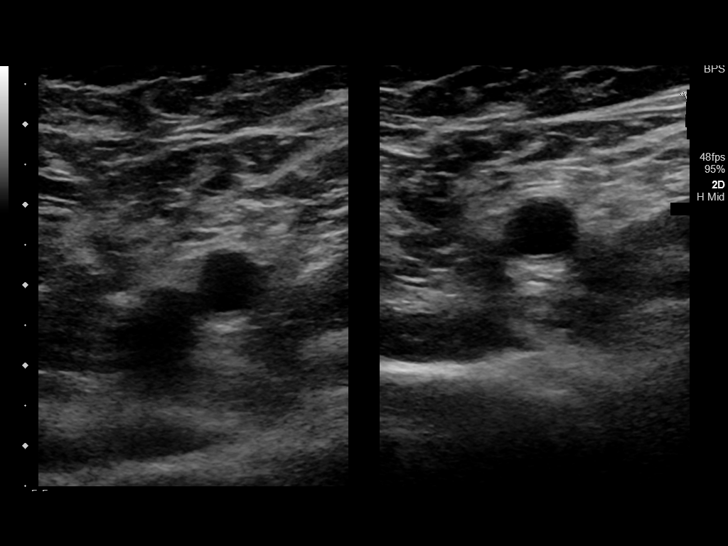
[im 4/40]
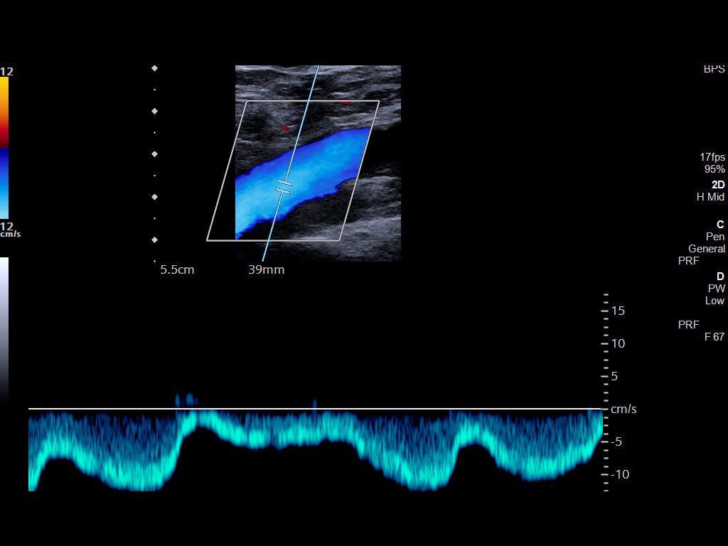
[im 7/40]
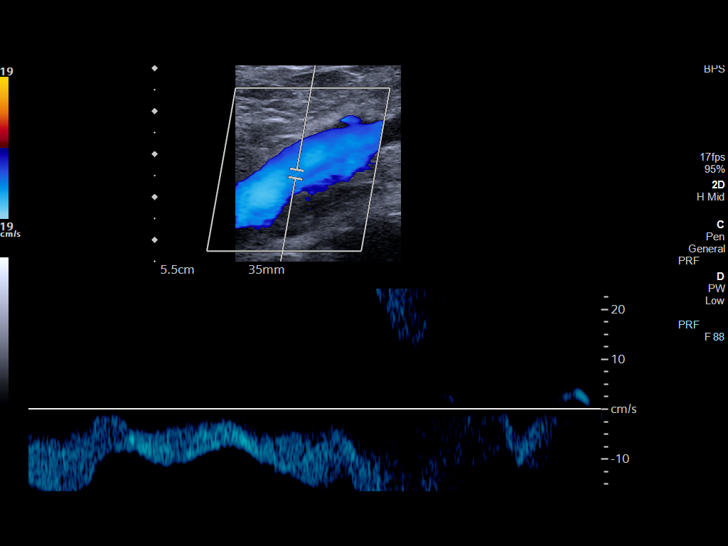
[im 11/40]
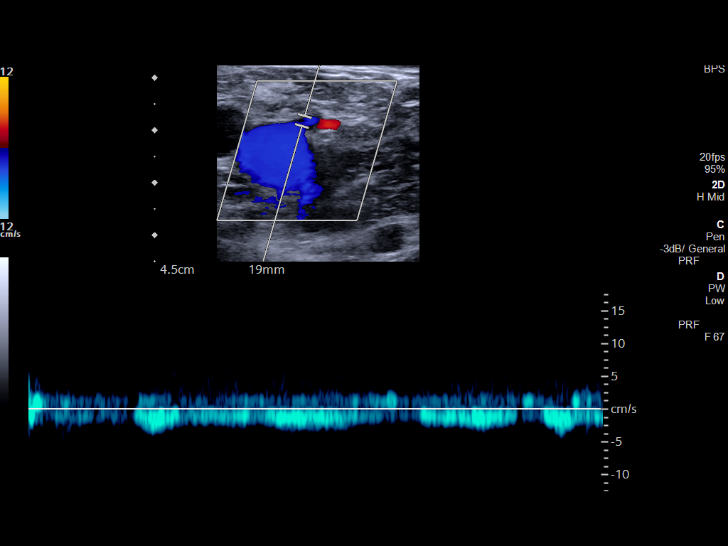
[im 14/40]
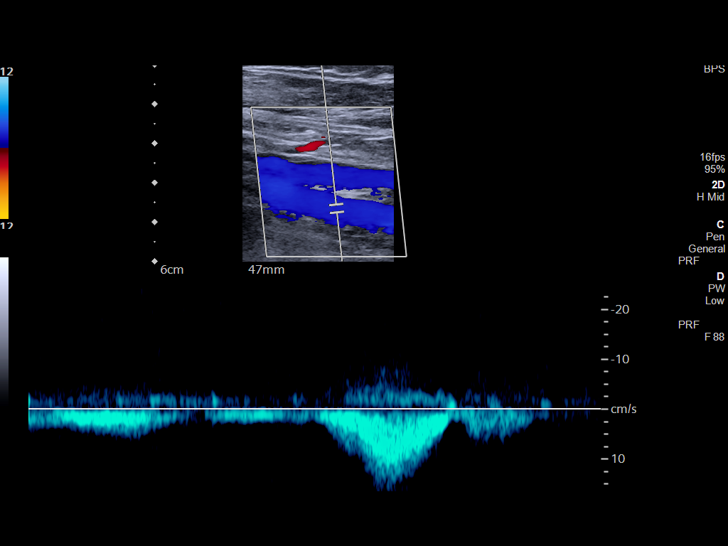
[im 17/40]
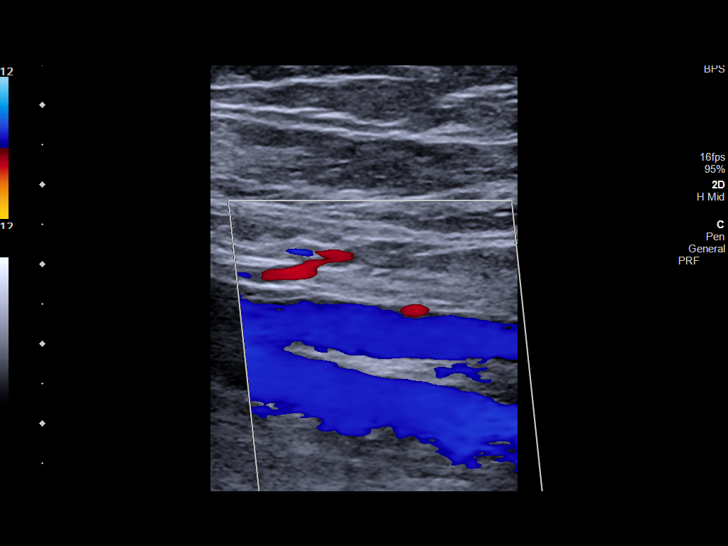
[im 23/40]
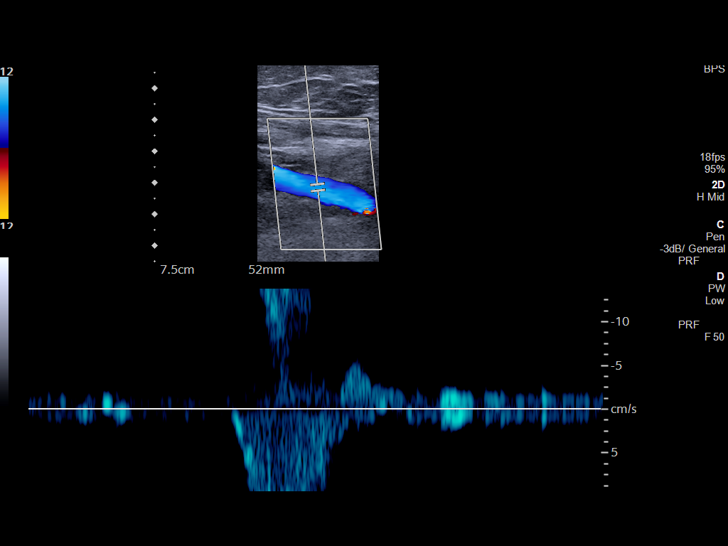
[im 24/40]
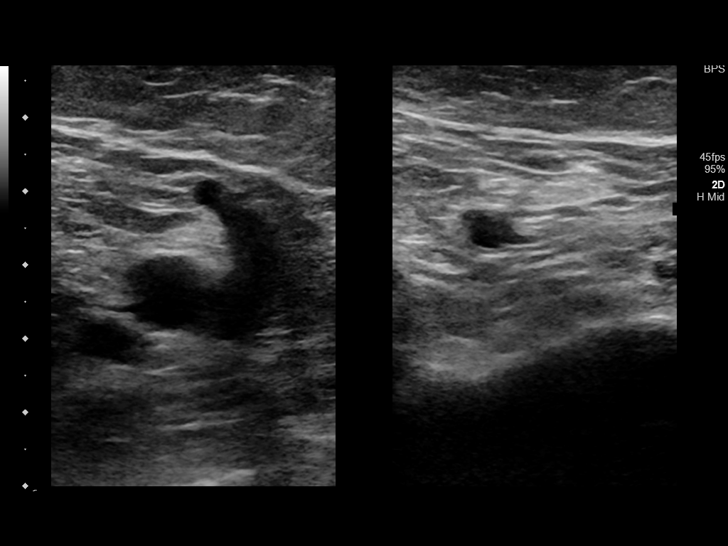
[im 28/40]
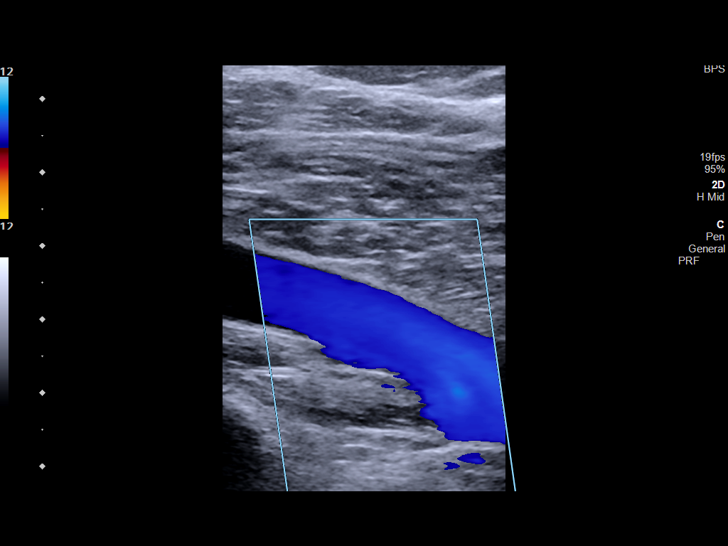
[im 31/40]
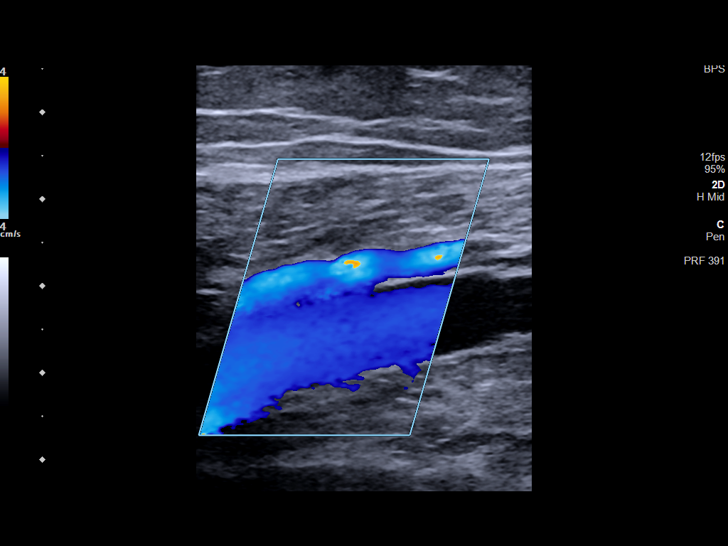
[im 34/40]
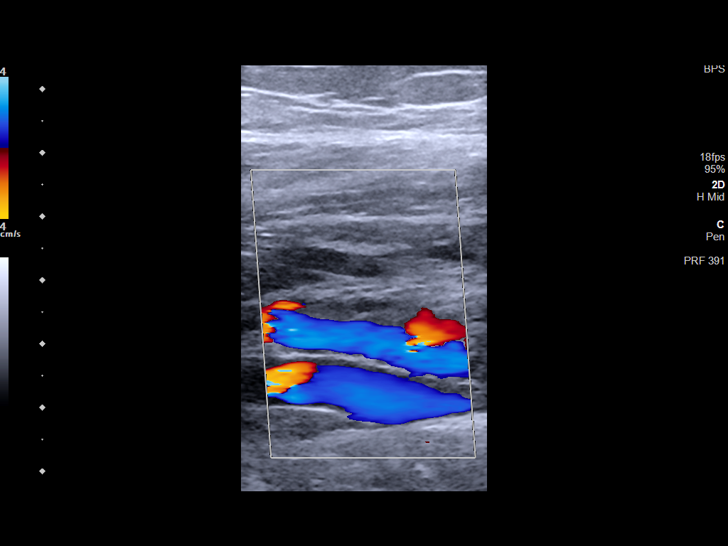
[im 38/40]
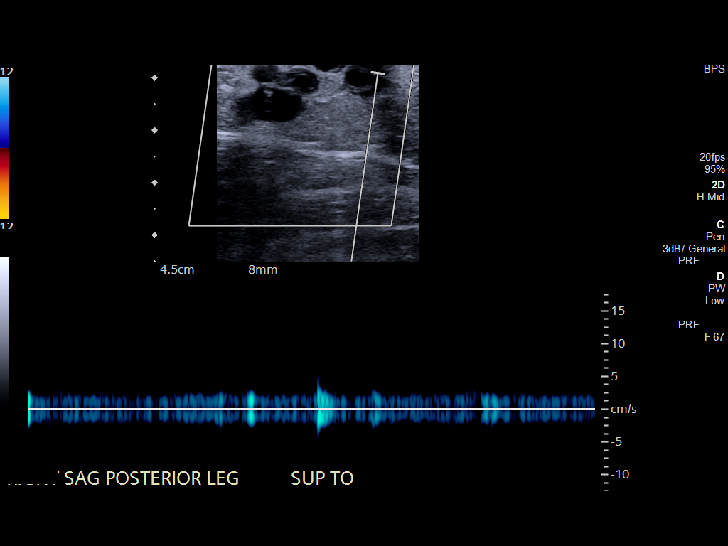
[im 40/40]
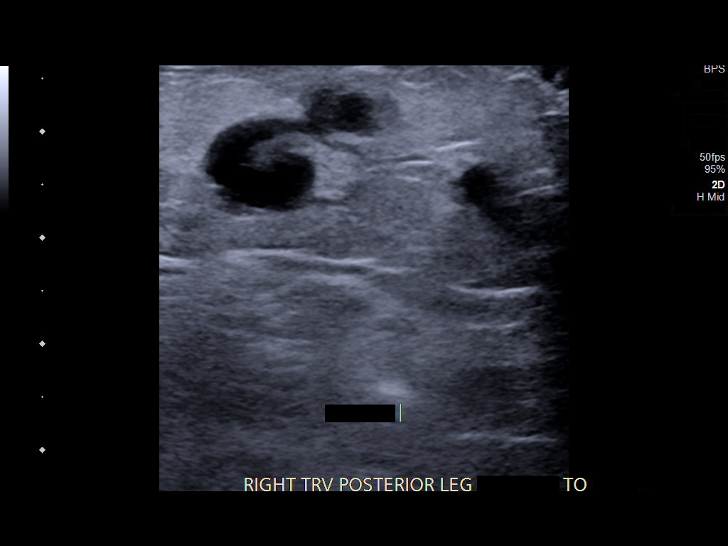

[13 of 24 positions shown; findings below may reference images not displayed]

FINDINGS: Contralateral Common Femoral Vein: Respiratory phasicity is normal
and symmetric with the symptomatic side. No evidence of thrombus.
Normal compressibility.

Common Femoral Vein: No evidence of thrombus. Normal
compressibility, respiratory phasicity and response to augmentation.

Saphenofemoral Junction: No evidence of thrombus. Normal
compressibility and flow on color Doppler imaging.

Profunda Femoral Vein: No evidence of thrombus. Normal
compressibility and flow on color Doppler imaging.

Femoral Vein: No evidence of thrombus. Normal compressibility,
respiratory phasicity and response to augmentation.

Popliteal Vein: No evidence of thrombus. Normal compressibility,
respiratory phasicity and response to augmentation.

Calf Veins: No evidence of thrombus. Normal compressibility and flow
on color Doppler imaging.

Superficial Great Saphenous Vein: Reportedly has been surgically
stripped.

Venous Reflux:  None.

Other Findings: Tortuous superficial varicosity is noted in the
posterior leg just above the knee which appears to be thrombosed.
IMPRESSION: No evidence of deep venous thrombosis seen in right lower extremity.

Tortuous superficial varicosity is noted in the posterior right leg
just above the knee which appears to be thrombosed, suggesting
superficial thrombophlebitis.

## 2019-12-20 DIAGNOSIS — K5909 Other constipation: Secondary | ICD-10-CM | POA: Diagnosis not present

## 2019-12-20 DIAGNOSIS — E063 Autoimmune thyroiditis: Secondary | ICD-10-CM | POA: Diagnosis not present

## 2019-12-21 ENCOUNTER — Telehealth: Payer: Medicare HMO | Admitting: Physician Assistant

## 2019-12-21 ENCOUNTER — Encounter: Payer: Self-pay | Admitting: Physician Assistant

## 2019-12-21 DIAGNOSIS — M549 Dorsalgia, unspecified: Secondary | ICD-10-CM | POA: Diagnosis not present

## 2019-12-21 DIAGNOSIS — N3000 Acute cystitis without hematuria: Secondary | ICD-10-CM

## 2019-12-21 NOTE — Progress Notes (Signed)
  Based on what you shared with me, I feel your condition warrants further evaluation and I recommend that you be seen for a face to face office visit.   NOTE: If you entered your credit card information for this eVisit, you will not be charged. You may see a "hold" on your card for the $35 but that hold will drop off and you will not have a charge processed.  Ms. Achey Due to your urinary symptoms and associated back pain, it is recommended that you have a face to face evaluation to ensure proper testings are completed and appropriate therapy is provided.   If you are having a true medical emergency please call 911.      For an urgent face to face visit, [facility] has five urgent care centers for your convenience:      NEW:  [facility] [place] at  Get Driving Directions [phone] [place], [place] [address] . 10 am - 6pm Monday - Friday    [facility]) Get Driving Directions [phone] [address] [place], [place] [address] . 10 am to 8 pm Monday-Friday . 12 pm to 8 pm [facility] at [facility] Get Driving Directions [phone] [place], [place], [place] [address] . 8 am to 8 pm Monday-Friday . 9 am to 6 pm Saturday . 11 am to 6 pm Sunday     [facility] at [facility] Get Driving Directions  [phone] [address] [place], [place] [address] . 8 am to 8 pm Monday-Friday . 8 am to 4 pm [facility] at [place] Get Driving Directions [phone] [address] [place],  [address] . 12 pm to 6 pm Monday-Friday      Your e-visit answers were reviewed by a board certified advanced clinical practitioner to complete your personal care plan.  Thank you for using e-Visits.    I spent 5-10 minutes on review and completion of this note- Lacy Duverney [facility]

## 2019-12-23 MED ORDER — CEPHALEXIN 500 MG PO CAPS: 500.0000 mg | ORAL_CAPSULE | Freq: Two times a day (BID) | ORAL | 0 refills | Status: DC

## 2019-12-23 NOTE — Progress Notes (Signed)
Approximately 5 minutes was spent documenting and reviewing patient's chart.   

## 2019-12-23 NOTE — Progress Notes (Signed)
Given that your back pain is from pulling weeds and not from the UTI we will treat. Please see below our treatment plan.  We are sorry that you are not feeling well.  Here is how we plan to help!  Based on what you shared with me it looks like you most likely have a simple urinary tract infection.  A UTI (Urinary Tract Infection) is a bacterial infection of the bladder.  Most cases of urinary tract infections are simple to treat but a key part of your care is to encourage you to drink plenty of fluids and watch your symptoms carefully.  I have prescribed Keflex 500 mg twice a day for 7 days.  Your symptoms should gradually improve. Call us if the burning in your urine worsens, you develop worsening fever, back pain or pelvic pain or if your symptoms do not resolve after completing the antibiotic.  Urinary tract infections can be prevented by drinking plenty of water to keep your body hydrated.  Also be sure when you wipe, wipe from front to back and don't hold it in!  If possible, empty your bladder every 4 hours.  Your e-visit answers were reviewed by a board certified advanced clinical practitioner to complete your personal care plan.  Depending on the condition, your plan could have included both over the counter or prescription medications.  If there is a problem please reply  once you have received a response from your provider.  Your safety is important to [place].  If you have drug allergies check your prescription carefully.    You can use MyChart to ask questions about today's visit, request a non-urgent call back, or ask for a work or school excuse for 24 hours related to this e-Visit. If it has been greater than 24 hours you will need to follow up with your provider, or enter a new e-Visit to address those concerns.   You will get an e-mail in the next two days asking about your experience.  I hope that your e-visit has been valuable and will speed your recovery. Thank you for using  e-visits.

## 2019-12-23 NOTE — Addendum Note (Signed)
Addended by: Evelina Dun A on: 12/23/2019 12:28 PM   Modules accepted: Orders

## 2020-01-09 DIAGNOSIS — H524 Presbyopia: Secondary | ICD-10-CM | POA: Diagnosis not present

## 2020-01-16 DIAGNOSIS — E063 Autoimmune thyroiditis: Secondary | ICD-10-CM | POA: Diagnosis not present

## 2020-02-14 DIAGNOSIS — R001 Bradycardia, unspecified: Secondary | ICD-10-CM | POA: Diagnosis not present

## 2020-02-14 DIAGNOSIS — Z1211 Encounter for screening for malignant neoplasm of colon: Secondary | ICD-10-CM | POA: Diagnosis not present

## 2020-02-14 DIAGNOSIS — K5909 Other constipation: Secondary | ICD-10-CM | POA: Diagnosis not present

## 2020-02-20 DIAGNOSIS — N390 Urinary tract infection, site not specified: Secondary | ICD-10-CM | POA: Diagnosis not present

## 2020-02-26 DIAGNOSIS — E063 Autoimmune thyroiditis: Secondary | ICD-10-CM | POA: Diagnosis not present

## 2020-02-26 DIAGNOSIS — I499 Cardiac arrhythmia, unspecified: Secondary | ICD-10-CM | POA: Diagnosis not present

## 2020-02-26 DIAGNOSIS — R030 Elevated blood-pressure reading, without diagnosis of hypertension: Secondary | ICD-10-CM | POA: Diagnosis not present

## 2020-02-26 DIAGNOSIS — R001 Bradycardia, unspecified: Secondary | ICD-10-CM | POA: Diagnosis not present

## 2020-03-03 DIAGNOSIS — Z1211 Encounter for screening for malignant neoplasm of colon: Secondary | ICD-10-CM | POA: Diagnosis not present

## 2020-03-14 DIAGNOSIS — R001 Bradycardia, unspecified: Secondary | ICD-10-CM | POA: Diagnosis not present

## 2020-03-24 DIAGNOSIS — R001 Bradycardia, unspecified: Secondary | ICD-10-CM | POA: Diagnosis not present

## 2020-04-17 ENCOUNTER — Other Ambulatory Visit: Payer: Self-pay

## 2020-04-17 ENCOUNTER — Other Ambulatory Visit
Admission: RE | Admit: 2020-04-17 | Discharge: 2020-04-17 | Disposition: A | Discharge status: Home / Self Care | Payer: Medicare HMO | Source: Ambulatory Visit | Attending: Gastroenterology | Admitting: Gastroenterology

## 2020-04-17 DIAGNOSIS — Z01812 Encounter for preprocedural laboratory examination: Secondary | ICD-10-CM | POA: Insufficient documentation

## 2020-04-17 DIAGNOSIS — Z20822 Contact with and (suspected) exposure to covid-19: Secondary | ICD-10-CM | POA: Diagnosis not present

## 2020-04-18 ENCOUNTER — Other Ambulatory Visit: Admission: RE | Admit: 2020-04-18 | Payer: Medicare HMO | Source: Ambulatory Visit

## 2020-04-18 LAB — SARS CORONAVIRUS 2 (TAT 6-24 HRS): SARS Coronavirus 2: NEGATIVE

## 2020-04-21 ENCOUNTER — Encounter: Payer: Self-pay | Admitting: *Deleted

## 2020-04-22 ENCOUNTER — Ambulatory Visit: Payer: Medicare HMO | Admitting: Certified Registered"

## 2020-04-22 ENCOUNTER — Encounter
Admission: RE | Disposition: A | Discharge status: Home / Self Care | Payer: Self-pay | Source: Home / Self Care | Attending: Gastroenterology

## 2020-04-22 ENCOUNTER — Ambulatory Visit
Admission: RE | Admit: 2020-04-22 | Discharge: 2020-04-22 | Disposition: A | Discharge status: Home / Self Care | Payer: Medicare HMO | Attending: Gastroenterology | Admitting: Gastroenterology

## 2020-04-22 DIAGNOSIS — Z79890 Hormone replacement therapy (postmenopausal): Secondary | ICD-10-CM | POA: Insufficient documentation

## 2020-04-22 DIAGNOSIS — Z980 Intestinal bypass and anastomosis status: Secondary | ICD-10-CM | POA: Insufficient documentation

## 2020-04-22 DIAGNOSIS — K640 First degree hemorrhoids: Secondary | ICD-10-CM | POA: Diagnosis not present

## 2020-04-22 DIAGNOSIS — R195 Other fecal abnormalities: Secondary | ICD-10-CM | POA: Insufficient documentation

## 2020-04-22 DIAGNOSIS — K5730 Diverticulosis of large intestine without perforation or abscess without bleeding: Secondary | ICD-10-CM | POA: Insufficient documentation

## 2020-04-22 DIAGNOSIS — Z7982 Long term (current) use of aspirin: Secondary | ICD-10-CM | POA: Insufficient documentation

## 2020-04-22 DIAGNOSIS — K649 Unspecified hemorrhoids: Secondary | ICD-10-CM | POA: Diagnosis not present

## 2020-04-22 DIAGNOSIS — K5790 Diverticulosis of intestine, part unspecified, without perforation or abscess without bleeding: Secondary | ICD-10-CM | POA: Diagnosis not present

## 2020-04-22 HISTORY — PX: COLONOSCOPY WITH PROPOFOL: SHX5780

## 2020-04-22 HISTORY — DX: Unspecified osteoarthritis, unspecified site: M19.90

## 2020-04-22 HISTORY — DX: Hypothyroidism, unspecified: E03.9

## 2020-04-22 SURGERY — COLONOSCOPY WITH PROPOFOL
Anesthesia: General

## 2020-04-22 MED ORDER — LIDOCAINE HCL (CARDIAC) PF 100 MG/5ML IV SOSY
PREFILLED_SYRINGE | INTRAVENOUS | Status: DC | PRN
  Administered 2020-04-22: 50 mg via INTRAVENOUS

## 2020-04-22 MED ORDER — PROPOFOL 10 MG/ML IV BOLUS
INTRAVENOUS | Status: DC | PRN
  Administered 2020-04-22: 90 mg via INTRAVENOUS

## 2020-04-22 MED ORDER — PROPOFOL 500 MG/50ML IV EMUL
INTRAVENOUS | Status: DC | PRN
  Administered 2020-04-22: 125 ug/kg/min via INTRAVENOUS

## 2020-04-22 MED ORDER — SODIUM CHLORIDE 0.9 % IV SOLN
INTRAVENOUS | Status: DC
  Administered 2020-04-22: 20 mL/h via INTRAVENOUS

## 2020-04-22 MED ORDER — PROPOFOL 500 MG/50ML IV EMUL
INTRAVENOUS | Status: AC
  Filled 2020-04-22: qty 50

## 2020-04-22 NOTE — Transfer of Care (Signed)
Immediate Anesthesia Transfer of Care Note  Patient: Madeline Green  Procedure(s) Performed: COLONOSCOPY WITH PROPOFOL (N/A )  Patient Location: PACU  Anesthesia Type:MAC  Level of Consciousness: oriented and drowsy  Airway & Oxygen Therapy: Patient Spontanous Breathing  Post-op Assessment: Report given to RN and Post -op Vital signs reviewed and stable  Post vital signs: stable  Last Vitals:  Vitals Value Taken Time  BP 108/63 04/22/20 1154  Temp    Pulse 62 04/22/20 1155  Resp 19 04/22/20 1155  SpO2 97 % 04/22/20 1155  Vitals shown include unvalidated device data.  Last Pain:  Vitals:   04/22/20 1105  TempSrc: Temporal  PainSc: 0-No pain         Complications: No complications documented.

## 2020-04-22 NOTE — Op Note (Signed)
[facility] Gastroenterology Patient Name: Arelly Whittenberg Procedure Date: 04/22/2020 11:22 AM MRN: [id number] Account #: [id number] Date of Birth: Dec 16, 1950 Admit Type: Outpatient Age: 70 Room: [facility] ENDO ROOM 1 Gender: Female Note Status: Finalized Procedure:             Colonoscopy Indications:           Positive Cologuard test Providers:             Andrey Farmer MD, MD Medicines:             Monitored Anesthesia Care Complications:         No immediate complications. Procedure:             Pre-Anesthesia Assessment:                        - Prior to the procedure, a History and Physical was                         performed, and patient medications and allergies were                         reviewed. The patient is competent. The risks and                         benefits of the procedure and the sedation options and                         risks were discussed with the patient. All questions                         were answered and informed consent was obtained.                         Patient identification and proposed procedure were                         verified by the physician, the nurse, the anesthetist                         and the technician in the endoscopy suite. Mental                         Status Examination: alert and oriented. Airway                         Examination: normal oropharyngeal airway and neck                         mobility. Respiratory Examination: clear to                         auscultation. CV Examination: normal. Prophylactic                         Antibiotics: The patient does not require prophylactic                         antibiotics. Prior Anticoagulants: The patient has  taken no previous anticoagulant or antiplatelet                         agents. ASA Grade Assessment: II - A patient with mild                         systemic disease. After reviewing the risks and                          benefits, the patient was deemed in satisfactory                         condition to undergo the procedure. The anesthesia                         plan was to use monitored anesthesia care (MAC).                         Immediately prior to administration of medications,                         the patient was re-assessed for adequacy to receive                         sedatives. The heart rate, respiratory rate, oxygen                         saturations, blood pressure, adequacy of pulmonary                         ventilation, and response to care were monitored                         throughout the procedure. The physical status of the                         patient was re-assessed after the procedure.                        After obtaining informed consent, the colonoscope was                         passed under direct vision. Throughout the procedure,                         the patient's blood pressure, pulse, and oxygen                         saturations were monitored continuously. The                         Colonoscope was introduced through the anus and                         advanced to the the cecum, identified by appendiceal                         orifice and ileocecal valve. The colonoscopy was  performed without difficulty. The patient tolerated                         the procedure well. The quality of the bowel                         preparation was good. Findings:      The perianal and digital rectal examinations were normal.      Multiple small and large-mouthed diverticula were found in the sigmoid       colon, descending colon, transverse colon and ascending colon.      There was evidence of a prior end-to-end colo-colonic anastomosis in the       sigmoid colon. This was patent.      Non-bleeding internal hemorrhoids were found during retroflexion. The       hemorrhoids were Grade I (internal hemorrhoids that do not prolapse).       The exam was otherwise without abnormality on direct and retroflexion       views. Impression:            - Diverticulosis in the sigmoid colon, in the                         descending colon, in the transverse colon and in the                         ascending colon.                        - Patent end-to-end colo-colonic anastomosis.                        - Non-bleeding internal hemorrhoids.                        - The examination was otherwise normal on direct and                         retroflexion views.                        - No specimens collected. Recommendation:        - Discharge patient to home.                        - Resume previous diet.                        - Continue present medications.                        - Repeat colonoscopy in 10 years for screening                         purposes.                        - Return to referring physician as previously                         scheduled. Procedure Code(s):     --- Professional ---  45378, Colonoscopy, flexible; diagnostic, including                         collection of specimen(s) by brushing or washing, when                         performed (separate procedure) Diagnosis Code(s):     --- Professional ---                        K64.0, First degree hemorrhoids                        Z98.0, Intestinal bypass and anastomosis status                        R19.5, Other fecal abnormalities                        K57.30, Diverticulosis of large intestine without                         perforation or abscess without bleeding CPT copyright 2019 American Medical Association. All rights reserved. The codes documented in this report are preliminary and upon coder review may  be revised to meet current compliance requirements. Andrey Farmer MD, MD 04/22/2020 11:54:11 AM Number of Addenda: 0 Note Initiated On: 04/22/2020 11:22 AM Scope Withdrawal Time: 0 hours 6 minutes 13 seconds  Total  Procedure Duration: 0 hours 8 minutes 25 seconds  Estimated Blood Loss:  Estimated blood loss: none.      [facility]

## 2020-04-22 NOTE — Anesthesia Preprocedure Evaluation (Signed)
Anesthesia Evaluation  Patient identified by MRN, date of birth, ID band Patient awake    Reviewed: Allergy & Precautions, H&P , NPO status , reviewed documented beta blocker date and time   Airway Mallampati: II  TM Distance: >3 FB Neck ROM: full    Dental  (+) Chipped, Teeth Intact   Pulmonary    Pulmonary exam normal        Cardiovascular Normal cardiovascular exam     Neuro/Psych    GI/Hepatic neg GERD  ,  Endo/Other  Hypothyroidism   Renal/GU      Musculoskeletal  (+) Arthritis ,   Abdominal   Peds  Hematology   Anesthesia Other Findings Past Medical History: No date: Acquired hypothyroidism No date: Allergy No date: Arthritis  Past Surgical History: No date: CHOLECYSTECTOMY No date: VARICOSE VEIN SURGERY; Left  BMI    Body Mass Index: 28.29 kg/m      Reproductive/Obstetrics                             Anesthesia Physical Anesthesia Plan  ASA: II  Anesthesia Plan: General   Post-op Pain Management:    Induction: Intravenous  PONV Risk Score and Plan: Treatment may vary due to age or medical condition and TIVA  Airway Management Planned: Nasal Cannula and Natural Airway  Additional Equipment:   Intra-op Plan:   Post-operative Plan:   Informed Consent: I have reviewed the patients History and Physical, chart, labs and discussed the procedure including the risks, benefits and alternatives for the proposed anesthesia with the patient or authorized representative who has indicated his/her understanding and acceptance.     Dental Advisory Given  Plan Discussed with: CRNA  Anesthesia Plan Comments:         Anesthesia Quick Evaluation

## 2020-04-22 NOTE — Interval H&P Note (Signed)
History and Physical Interval Note:  04/22/2020 11:35 AM  Madeline Green  has presented today for surgery, with the diagnosis of + COLOGUARD.  The various methods of treatment have been discussed with the patient and family. After consideration of risks, benefits and other options for treatment, the patient has consented to  Procedure(s): COLONOSCOPY WITH PROPOFOL (N/A) as a surgical intervention.  The patient's history has been reviewed, patient examined, no change in status, stable for surgery.  I have reviewed the patient's chart and labs.  Questions were answered to the patient's satisfaction.     Lesly Rubenstein  Ok to proceed with colonoscopy

## 2020-04-22 NOTE — H&P (Signed)
Outpatient short stay form Pre-procedure 04/22/2020 11:33 AM Raylene Miyamoto MD, MPH  Primary Physician: Dr. Ouida Sills  Reason for visit:  Positive cologuard  History of present illness:   70 y/o lady with history of complicated diverticulitis in 2015 requiring sigmoidectomy. Had colonoscopy before surgery. No polyps. No family history of GI malignancies. No blood thinners. No new GI symptoms. No other abdominal surgeries.    Current Facility-Administered Medications:  .  0.9 %  sodium chloride infusion, , Intravenous, Continuous, Elah Avellino, Hilton Cork, MD, Last Rate: 20 mL/hr at 04/22/20 1128, Continued from Pre-op at 04/22/20 1128  Medications Prior to Admission  Medication Sig Dispense Refill Last Dose  . aspirin EC 81 MG tablet Take 81 mg by mouth daily. Swallow whole.   Past Week at Unknown time  . CALCIUM-MAGNESIUM PO Take by mouth.   Past Week at Unknown time  . cephALEXin (KEFLEX) 500 MG capsule Take 1 capsule (500 mg total) by mouth 2 (two) times daily. 14 capsule 0 Past Week at Unknown time  . Folic Acid-Cholecalciferol 03-4998 MG-UNIT TABS Take by mouth.   Past Week at Unknown time  . levothyroxine (SYNTHROID) 100 MCG tablet Take 100 mcg by mouth daily before breakfast.   04/22/2020 at 0600  . Pine Bark Extract 95 % POWD by Does not apply route.   Past Week at Unknown time     Allergies  Allergen Reactions  . Betadine [Povidone Iodine] Hives     Past Medical History:  Diagnosis Date  . Acquired hypothyroidism   . Allergy   . Arthritis     Review of systems:  Otherwise negative.    Physical Exam  Gen: Alert, oriented. Appears stated age.  HEENT: PERRLA. Lungs: No respiratory distress CV: RRR Abd: soft, benign, no masses Ext: No edema    Planned procedures: Proceed with colonoscopy. The patient understands the nature of the planned procedure, indications, risks, alternatives and potential complications including but not limited to bleeding, infection,  perforation, damage to internal organs and possible oversedation/side effects from anesthesia. The patient agrees and gives consent to proceed.  Please refer to procedure notes for findings, recommendations and patient disposition/instructions.     Raylene Miyamoto MD, MPH Gastroenterology 04/22/2020  11:33 AM

## 2020-04-22 NOTE — Anesthesia Postprocedure Evaluation (Signed)
Anesthesia Post Note  Patient: TAJANAY HURLEY  Procedure(s) Performed: COLONOSCOPY WITH PROPOFOL (N/A )  Patient location during evaluation: Endoscopy Anesthesia Type: General Level of consciousness: awake and alert Pain management: pain level controlled Vital Signs Assessment: post-procedure vital signs reviewed and stable Respiratory status: spontaneous breathing, nonlabored ventilation and respiratory function stable Cardiovascular status: blood pressure returned to baseline and stable Postop Assessment: no apparent nausea or vomiting Anesthetic complications: no   No complications documented.   Last Vitals:  Vitals:   04/22/20 1214 04/22/20 1224  BP: 137/74 (!) 155/76  Pulse: (!) 54 (!) 51  Resp: 15 (!) 26  Temp:    SpO2: 100% 97%    Last Pain:  Vitals:   04/22/20 1224  TempSrc:   PainSc: 0-No pain                 Alphonsus Sias

## 2020-04-23 ENCOUNTER — Encounter: Payer: Self-pay | Admitting: Gastroenterology

## 2020-06-09 DIAGNOSIS — N390 Urinary tract infection, site not specified: Secondary | ICD-10-CM | POA: Diagnosis not present

## 2020-06-20 ENCOUNTER — Telehealth: Payer: Medicare HMO | Admitting: Emergency Medicine

## 2020-06-20 DIAGNOSIS — R399 Unspecified symptoms and signs involving the genitourinary system: Secondary | ICD-10-CM | POA: Diagnosis not present

## 2020-06-20 MED ORDER — CEPHALEXIN 500 MG PO CAPS: 500.0000 mg | ORAL_CAPSULE | Freq: Two times a day (BID) | ORAL | 0 refills | Status: AC

## 2020-06-20 NOTE — Progress Notes (Signed)

## 2020-08-16 IMAGING — CT CT ANGIO CHEST
2 of 7 series · 13 of 36 positions shown · IV contrast (omnipaque)
Comparison: CT Abdomen and Pelvis 04/24/2013.

CLINICAL DATA: 68-year-old female with shortness of breath. History
of right lower extremity DVT.

EXAM:
CT ANGIOGRAPHY CHEST WITH CONTRAST
TECHNIQUE: Multidetector CT imaging of the chest was performed using the
standard protocol during bolus administration of intravenous
contrast. Multiplanar CT image reconstructions and MIPs were
obtained to evaluate the vascular anatomy.
CONTRAST:  75mL OMNIPAQUE IOHEXOL 350 MG/ML SOLN

[Series 6: thins cta pulmonary 1.00 · axial · 0.61mm/px · z∈[-1151,-913]mm · 12 of 282 slices shown]
[im 22/282  lung]
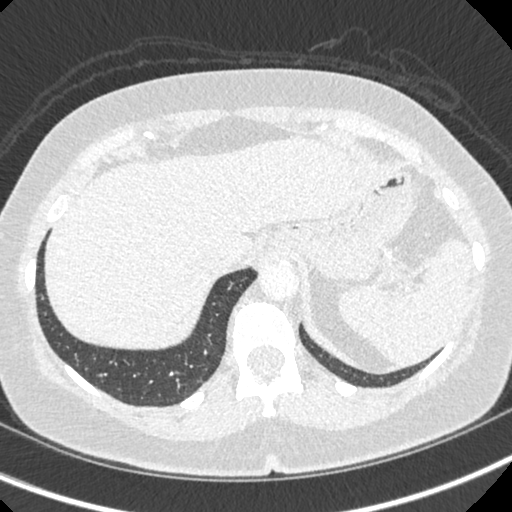
[im 44/282  mediastinal]
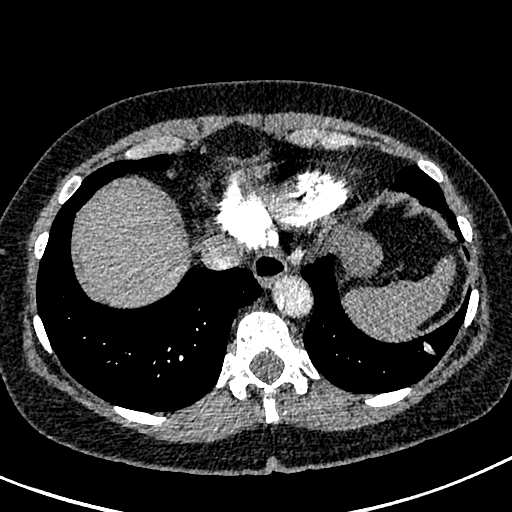
[im 65/282  lung]
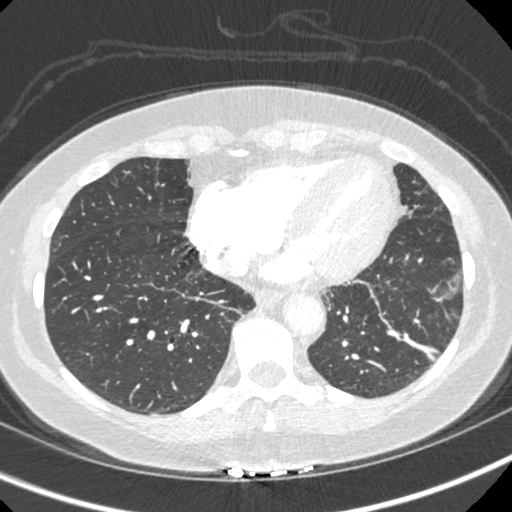
[im 87/282  mediastinal]
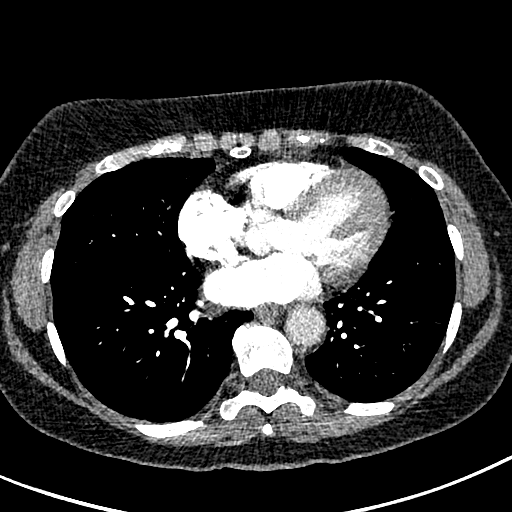
[im 109/282  lung]
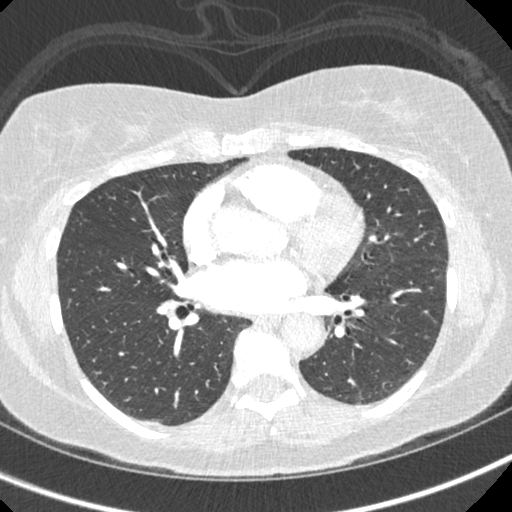
[im 130/282  mediastinal]
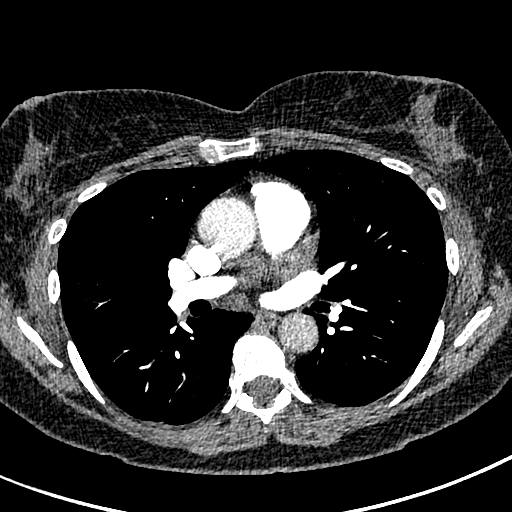
[im 152/282  lung]
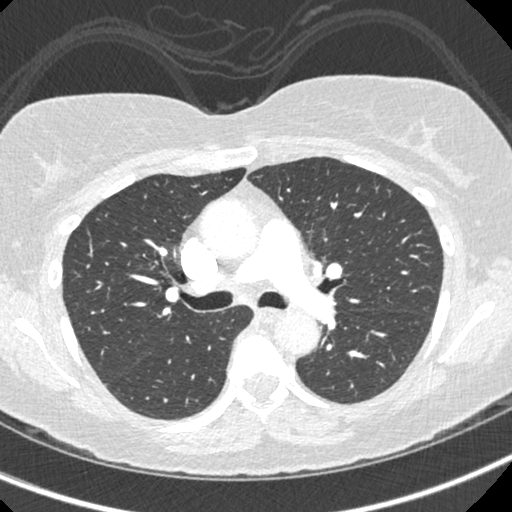
[im 173/282  mediastinal]
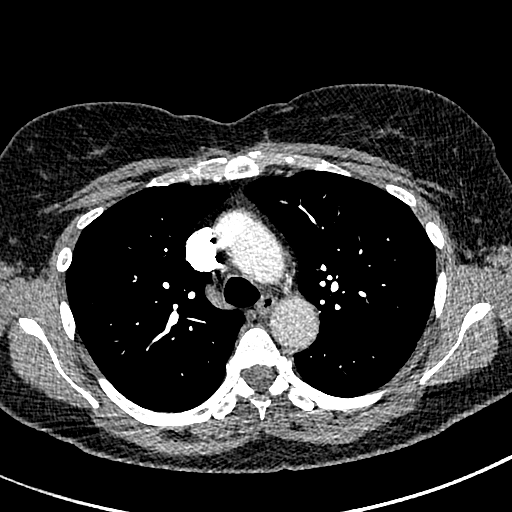
[im 195/282  lung]
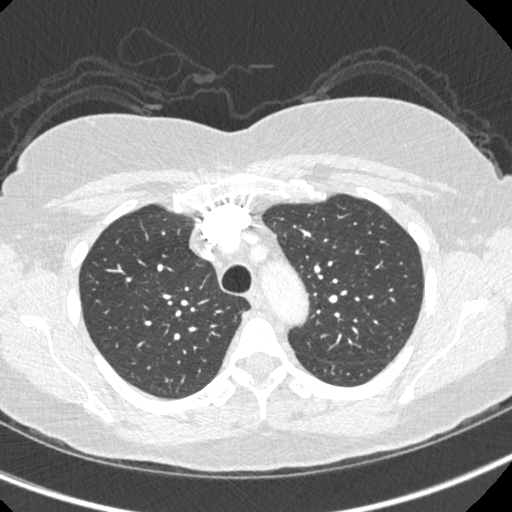
[im 217/282  mediastinal]
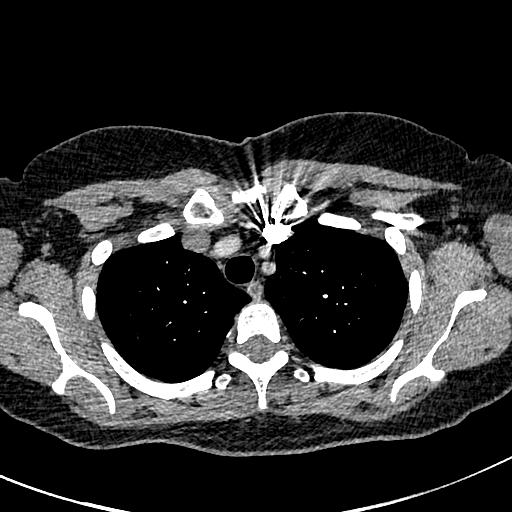
[im 238/282  lung]
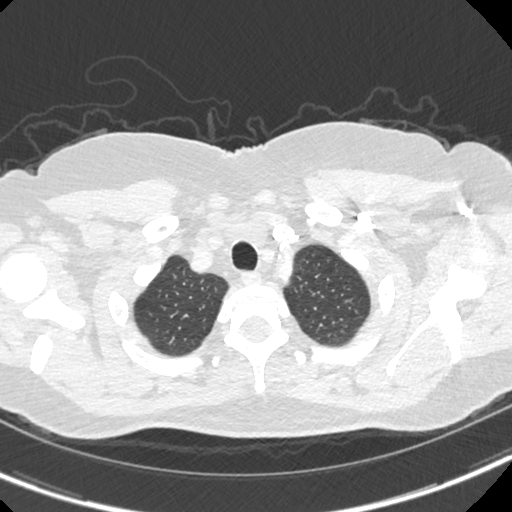
[im 260/282  mediastinal]
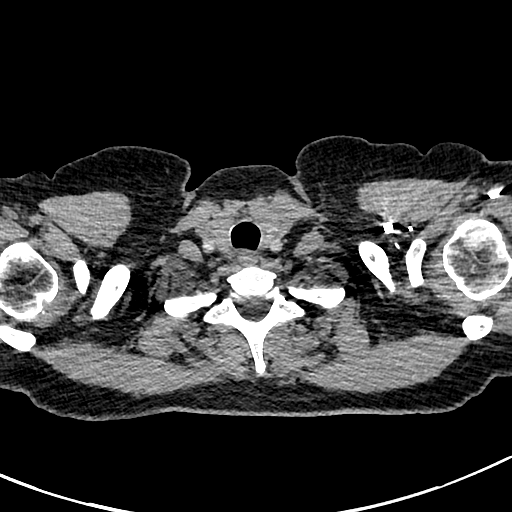

[Series 8: cta pulmonary 2.00 cor · coronal · 0.55mm/px · 1 of 127 slices shown]
[im 64/127  mediastinal]
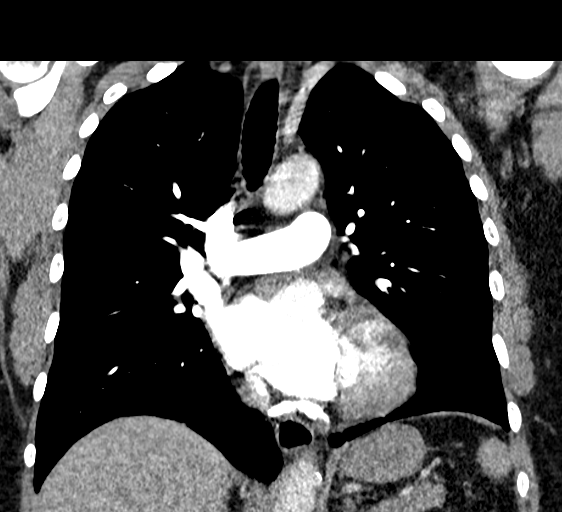

[13 of 36 positions shown; findings below may reference images not displayed]

FINDINGS: Cardiovascular: Good contrast bolus timing in the pulmonary arterial
tree.

No focal filling defect identified in the pulmonary arteries to
suggest acute pulmonary embolism.

Trace calcified coronary artery atherosclerosis suspected on series
6, image 164. There is little contrast in the aorta. No cardiomegaly
or pericardial effusion.

Mediastinum/Nodes: Negative, no lymphadenopathy.

Lungs/Pleura: Major airways are patent. Linear atelectasis or less
likely scarring at the lung bases is greater on the left and
increased since 9038. No pleural effusion or other abnormal
pulmonary opacity.

Upper Abdomen: Negative visible liver, spleen, pancreas and stomach.

Musculoskeletal: Negative.

Review of the MIP images confirms the above findings.
IMPRESSION: 1. Negative for acute pulmonary embolus.
2. Mild lung base atelectasis or scarring. No other acute findings
in the chest.

## 2020-08-28 DIAGNOSIS — E785 Hyperlipidemia, unspecified: Secondary | ICD-10-CM | POA: Diagnosis not present

## 2020-08-28 DIAGNOSIS — Z0000 Encounter for general adult medical examination without abnormal findings: Secondary | ICD-10-CM | POA: Diagnosis not present

## 2020-08-28 DIAGNOSIS — E079 Disorder of thyroid, unspecified: Secondary | ICD-10-CM | POA: Diagnosis not present

## 2020-08-28 DIAGNOSIS — I10 Essential (primary) hypertension: Secondary | ICD-10-CM | POA: Diagnosis not present

## 2020-08-28 DIAGNOSIS — R002 Palpitations: Secondary | ICD-10-CM | POA: Diagnosis not present

## 2020-08-28 DIAGNOSIS — Z79899 Other long term (current) drug therapy: Secondary | ICD-10-CM | POA: Diagnosis not present

## 2020-09-09 DIAGNOSIS — R55 Syncope and collapse: Secondary | ICD-10-CM | POA: Diagnosis not present

## 2020-09-09 DIAGNOSIS — I6523 Occlusion and stenosis of bilateral carotid arteries: Secondary | ICD-10-CM | POA: Diagnosis not present

## 2020-12-05 DIAGNOSIS — E7800 Pure hypercholesterolemia, unspecified: Secondary | ICD-10-CM | POA: Diagnosis not present

## 2020-12-05 DIAGNOSIS — R946 Abnormal results of thyroid function studies: Secondary | ICD-10-CM | POA: Diagnosis not present

## 2021-02-04 ENCOUNTER — Other Ambulatory Visit: Payer: Self-pay

## 2021-02-06 ENCOUNTER — Other Ambulatory Visit: Payer: Self-pay

## 2021-02-06 ENCOUNTER — Ambulatory Visit: Payer: Medicare HMO | Attending: Internal Medicine

## 2021-02-06 DIAGNOSIS — Z23 Encounter for immunization: Secondary | ICD-10-CM

## 2021-02-06 MED ORDER — PFIZER COVID-19 VAC BIVALENT 30 MCG/0.3ML IM SUSP
INTRAMUSCULAR | 0 refills | Status: DC
  Filled 2021-02-06: qty 0.3, 1d supply, fill #0

## 2021-02-06 NOTE — Progress Notes (Signed)
   Covid-19 Vaccination Clinic  Name:  Madeline Green    MRN: [id number] DOB: 03-Feb-1951  02/06/2021  [name] was observed post Covid-19 immunization for 15 minutes without incident. She was provided with Vaccine Information Sheet and instruction to access the V-Safe system.   [name] was instructed to call 911 with any severe reactions post vaccine: Difficulty breathing  Swelling of face and throat  A fast heartbeat  A bad rash all over body  Dizziness and weakness   Immunizations Administered     Name Date Dose VIS Date Route   Pfizer Covid-19 Vaccine Bivalent Booster 02/06/2021 11:27 AM 0.3 mL 11/26/2020 Intramuscular   Manufacturer: [place]   Lot: [id number]   [place]: Coy, PharmD, MBA Clinical Acute Care Pharmacist

## 2021-02-17 DIAGNOSIS — H5213 Myopia, bilateral: Secondary | ICD-10-CM | POA: Diagnosis not present

## 2021-02-20 DIAGNOSIS — Z0100 Encounter for examination of eyes and vision without abnormal findings: Secondary | ICD-10-CM | POA: Diagnosis not present

## 2021-03-03 DIAGNOSIS — E538 Deficiency of other specified B group vitamins: Secondary | ICD-10-CM | POA: Insufficient documentation

## 2021-03-03 DIAGNOSIS — I83813 Varicose veins of bilateral lower extremities with pain: Secondary | ICD-10-CM | POA: Diagnosis not present

## 2021-03-03 DIAGNOSIS — Z0000 Encounter for general adult medical examination without abnormal findings: Secondary | ICD-10-CM | POA: Diagnosis not present

## 2021-03-03 DIAGNOSIS — E079 Disorder of thyroid, unspecified: Secondary | ICD-10-CM | POA: Diagnosis not present

## 2021-04-20 DIAGNOSIS — N390 Urinary tract infection, site not specified: Secondary | ICD-10-CM | POA: Diagnosis not present

## 2021-04-21 ENCOUNTER — Other Ambulatory Visit (INDEPENDENT_AMBULATORY_CARE_PROVIDER_SITE_OTHER): Payer: Self-pay | Admitting: Nurse Practitioner

## 2021-04-21 DIAGNOSIS — I83813 Varicose veins of bilateral lower extremities with pain: Secondary | ICD-10-CM

## 2021-04-22 ENCOUNTER — Ambulatory Visit (INDEPENDENT_AMBULATORY_CARE_PROVIDER_SITE_OTHER): Payer: Medicare HMO

## 2021-04-22 ENCOUNTER — Other Ambulatory Visit: Payer: Self-pay

## 2021-04-22 ENCOUNTER — Ambulatory Visit (INDEPENDENT_AMBULATORY_CARE_PROVIDER_SITE_OTHER): Payer: Self-pay | Admitting: Nurse Practitioner

## 2021-04-22 ENCOUNTER — Encounter (INDEPENDENT_AMBULATORY_CARE_PROVIDER_SITE_OTHER): Payer: Self-pay | Admitting: Nurse Practitioner

## 2021-04-22 VITALS — BP 118/76 | HR 61 | Resp 16 | Ht 64.0 in | Wt 168.0 lb

## 2021-04-22 DIAGNOSIS — I83813 Varicose veins of bilateral lower extremities with pain: Secondary | ICD-10-CM | POA: Diagnosis not present

## 2021-05-09 ENCOUNTER — Encounter (INDEPENDENT_AMBULATORY_CARE_PROVIDER_SITE_OTHER): Payer: Self-pay | Admitting: Nurse Practitioner

## 2021-06-09 ENCOUNTER — Telehealth: Payer: Medicare HMO | Admitting: Nurse Practitioner

## 2021-06-09 DIAGNOSIS — N390 Urinary tract infection, site not specified: Secondary | ICD-10-CM | POA: Diagnosis not present

## 2021-06-09 DIAGNOSIS — R399 Unspecified symptoms and signs involving the genitourinary system: Secondary | ICD-10-CM | POA: Diagnosis not present

## 2021-06-10 MED ORDER — CEPHALEXIN 500 MG PO CAPS: 500.0000 mg | ORAL_CAPSULE | Freq: Two times a day (BID) | ORAL | 0 refills | Status: DC

## 2021-06-10 NOTE — Progress Notes (Signed)

## 2021-09-01 DIAGNOSIS — Z0000 Encounter for general adult medical examination without abnormal findings: Secondary | ICD-10-CM | POA: Diagnosis not present

## 2021-09-01 DIAGNOSIS — E079 Disorder of thyroid, unspecified: Secondary | ICD-10-CM | POA: Diagnosis not present

## 2021-09-01 DIAGNOSIS — Z79899 Other long term (current) drug therapy: Secondary | ICD-10-CM | POA: Diagnosis not present

## 2021-09-01 DIAGNOSIS — E785 Hyperlipidemia, unspecified: Secondary | ICD-10-CM | POA: Diagnosis not present

## 2021-09-01 DIAGNOSIS — E538 Deficiency of other specified B group vitamins: Secondary | ICD-10-CM | POA: Diagnosis not present

## 2021-10-13 DIAGNOSIS — H40003 Preglaucoma, unspecified, bilateral: Secondary | ICD-10-CM | POA: Diagnosis not present

## 2021-10-13 DIAGNOSIS — H35371 Puckering of macula, right eye: Secondary | ICD-10-CM | POA: Diagnosis not present

## 2021-10-13 DIAGNOSIS — H52203 Unspecified astigmatism, bilateral: Secondary | ICD-10-CM | POA: Diagnosis not present

## 2021-10-13 DIAGNOSIS — H2513 Age-related nuclear cataract, bilateral: Secondary | ICD-10-CM | POA: Diagnosis not present

## 2021-11-11 DIAGNOSIS — H2512 Age-related nuclear cataract, left eye: Secondary | ICD-10-CM | POA: Diagnosis not present

## 2021-11-23 ENCOUNTER — Encounter: Payer: Self-pay | Admitting: Ophthalmology

## 2021-12-01 NOTE — Discharge Instructions (Signed)

## 2021-12-02 ENCOUNTER — Other Ambulatory Visit: Payer: Self-pay

## 2021-12-02 ENCOUNTER — Ambulatory Visit: Payer: Medicare HMO | Admitting: Anesthesiology

## 2021-12-02 ENCOUNTER — Encounter
Admission: RE | Disposition: A | Discharge status: Home / Self Care | Payer: Self-pay | Source: Home / Self Care | Attending: Ophthalmology

## 2021-12-02 ENCOUNTER — Encounter: Payer: Self-pay | Admitting: Ophthalmology

## 2021-12-02 ENCOUNTER — Ambulatory Visit (AMBULATORY_SURGERY_CENTER): Payer: Medicare HMO | Admitting: Anesthesiology

## 2021-12-02 ENCOUNTER — Ambulatory Visit
Admission: RE | Admit: 2021-12-02 | Discharge: 2021-12-02 | Disposition: A | Discharge status: Home / Self Care | Payer: Medicare HMO | Attending: Ophthalmology | Admitting: Ophthalmology

## 2021-12-02 DIAGNOSIS — E039 Hypothyroidism, unspecified: Secondary | ICD-10-CM | POA: Insufficient documentation

## 2021-12-02 DIAGNOSIS — H2512 Age-related nuclear cataract, left eye: Secondary | ICD-10-CM | POA: Diagnosis not present

## 2021-12-02 DIAGNOSIS — M1990 Unspecified osteoarthritis, unspecified site: Secondary | ICD-10-CM | POA: Diagnosis not present

## 2021-12-02 DIAGNOSIS — I10 Essential (primary) hypertension: Secondary | ICD-10-CM

## 2021-12-02 HISTORY — PX: CATARACT EXTRACTION W/PHACO: SHX586

## 2021-12-02 HISTORY — DX: Personal history of other venous thrombosis and embolism: Z86.718

## 2021-12-02 SURGERY — PHACOEMULSIFICATION, CATARACT, WITH IOL INSERTION
Anesthesia: Monitor Anesthesia Care | Site: Eye | Laterality: Left

## 2021-12-02 MED ORDER — SIGHTPATH DOSE#1 BSS IO SOLN
INTRAOCULAR | Status: DC | PRN
  Administered 2021-12-02: 2 mL

## 2021-12-02 MED ORDER — SIGHTPATH DOSE#1 NA HYALUR & NA CHOND-NA HYALUR IO KIT
PACK | INTRAOCULAR | Status: DC | PRN
  Administered 2021-12-02: 1 via OPHTHALMIC

## 2021-12-02 MED ORDER — SIGHTPATH DOSE#1 BSS IO SOLN
INTRAOCULAR | Status: DC | PRN
  Administered 2021-12-02: 15 mL via INTRAOCULAR

## 2021-12-02 MED ORDER — FENTANYL CITRATE (PF) 100 MCG/2ML IJ SOLN
INTRAMUSCULAR | Status: DC | PRN
Start: 2021-12-02 — End: 2021-12-02
  Administered 2021-12-02: 50 ug via INTRAVENOUS

## 2021-12-02 MED ORDER — SIGHTPATH DOSE#1 BSS IO SOLN
INTRAOCULAR | Status: DC | PRN
  Administered 2021-12-02: 68 mL via OPHTHALMIC

## 2021-12-02 MED ORDER — CEFUROXIME OPHTHALMIC INJECTION 1 MG/0.1 ML
INJECTION | OPHTHALMIC | Status: DC | PRN
  Administered 2021-12-02: 0.1 mL via INTRACAMERAL

## 2021-12-02 MED ORDER — MIDAZOLAM HCL 2 MG/2ML IJ SOLN
INTRAMUSCULAR | Status: DC | PRN
  Administered 2021-12-02: 1 mg via INTRAVENOUS

## 2021-12-02 MED ORDER — ARMC OPHTHALMIC DILATING DROPS
1.0000 | OPHTHALMIC | Status: DC | PRN
  Administered 2021-12-02 (×3): 1 via OPHTHALMIC

## 2021-12-02 MED ORDER — BRIMONIDINE TARTRATE-TIMOLOL 0.2-0.5 % OP SOLN
OPHTHALMIC | Status: DC | PRN
  Administered 2021-12-02: 1 [drp] via OPHTHALMIC

## 2021-12-02 MED ORDER — TETRACAINE HCL 0.5 % OP SOLN
1.0000 [drp] | OPHTHALMIC | Status: DC | PRN
  Administered 2021-12-02 (×3): 1 [drp] via OPHTHALMIC

## 2021-12-02 SURGICAL SUPPLY — 13 items
CANNULA ANT/CHMB 27G (MISCELLANEOUS) IMPLANT
CANNULA ANT/CHMB 27GA (MISCELLANEOUS) IMPLANT
CATARACT SUITE SIGHTPATH (MISCELLANEOUS) ×1 IMPLANT
FEE CATARACT SUITE SIGHTPATH (MISCELLANEOUS) ×1 IMPLANT
GLOVE SRG 8 PF TXTR STRL LF DI (GLOVE) ×1 IMPLANT
GLOVE SURG ENC TEXT LTX SZ7.5 (GLOVE) ×1 IMPLANT
GLOVE SURG UNDER POLY LF SZ8 (GLOVE) ×1
LENS IOL TECNIS EYHANCE 17.5 (Intraocular Lens) IMPLANT
NDL FILTER BLUNT 18X1 1/2 (NEEDLE) ×1 IMPLANT
NEEDLE FILTER BLUNT 18X 1/2SAF (NEEDLE) ×1
NEEDLE FILTER BLUNT 18X1 1/2 (NEEDLE) ×1 IMPLANT
SYR 3ML LL SCALE MARK (SYRINGE) ×1 IMPLANT
WATER STERILE IRR 250ML POUR (IV SOLUTION) ×1 IMPLANT

## 2021-12-02 NOTE — Transfer of Care (Signed)
Immediate Anesthesia Transfer of Care Note  Patient: Madeline Green  Procedure(s) Performed: CATARACT EXTRACTION PHACO AND INTRAOCULAR LENS PLACEMENT (IOC) LEFT 7.46 00:53.5 (Left: Eye)  Patient Location: PACU  Anesthesia Type: No value filed.  Level of Consciousness: awake, alert  and patient cooperative  Airway and Oxygen Therapy: Patient Spontanous Breathing and Patient connected to supplemental oxygen  Post-op Assessment: Post-op Vital signs reviewed, Patient's Cardiovascular Status Stable, Respiratory Function Stable, Patent Airway and No signs of Nausea or vomiting  Post-op Vital Signs: Reviewed and stable  Complications: There were no known notable events for this encounter.

## 2021-12-02 NOTE — Op Note (Signed)
OPERATIVE NOTE  Madeline Green [id number] 12/02/2021   PREOPERATIVE DIAGNOSIS:  Nuclear sclerotic cataract left eye. H25.12   POSTOPERATIVE DIAGNOSIS:    Nuclear sclerotic cataract left eye.     PROCEDURE:  Phacoemusification with posterior chamber intraocular lens placement of the left eye  Ultrasound time: Procedure(s): CATARACT EXTRACTION PHACO AND INTRAOCULAR LENS PLACEMENT (IOC) LEFT 7.46 00:53.5 (Left)  LENS:   Implant Name Type Inv. Item Serial No. Manufacturer Lot No. LRB No. Used Action  LENS IOL TECNIS EYHANCE 17.5 - [id number] Intraocular Lens LENS IOL TECNIS EYHANCE 17.5 [id number] [employer]  Left 1 Implanted      SURGEON:  Wyonia Hough, MD   ANESTHESIA:  Topical with tetracaine drops and 2% Xylocaine jelly, augmented with 1% preservative-free intracameral lidocaine.    COMPLICATIONS:  None.   DESCRIPTION OF PROCEDURE:  The patient was identified in the holding room and transported to the operating room and placed in the supine position under the operating microscope.  The left eye was identified as the operative eye and it was prepped and draped in the usual sterile ophthalmic fashion.   A 1 millimeter clear-corneal paracentesis was made at the 1:30 position.  0.5 ml of preservative-free 1% lidocaine was injected into the anterior chamber.  The anterior chamber was filled with Viscoat viscoelastic.  A 2.4 millimeter keratome was used to make a near-clear corneal incision at the 10:30 position.  .  A curvilinear capsulorrhexis was made with a cystotome and capsulorrhexis forceps.  Balanced salt solution was used to hydrodissect and hydrodelineate the nucleus.   Phacoemulsification was then used in stop and chop fashion to remove the lens nucleus and epinucleus.  The remaining cortex was then removed using the irrigation and aspiration handpiece. Provisc was then placed into the capsular bag to distend it for lens placement.  A lens was then injected into the  capsular bag.  The remaining viscoelastic was aspirated.   Wounds were hydrated with balanced salt solution.  The anterior chamber was inflated to a physiologic pressure with balanced salt solution.  No wound leaks were noted. Cefuroxime 0.1 ml of a '10mg'$ /ml solution was injected into the anterior chamber for a dose of 1 mg of intracameral antibiotic at the completion of the case.   Timolol and Brimonidine drops were applied to the eye.  The patient was taken to the recovery room in stable condition without complications of anesthesia or surgery.  [name] 12/02/2021, 8:51 AM

## 2021-12-02 NOTE — Anesthesia Postprocedure Evaluation (Signed)
Anesthesia Post Note  Patient: Madeline Green  Procedure(s) Performed: CATARACT EXTRACTION PHACO AND INTRAOCULAR LENS PLACEMENT (IOC) LEFT 7.46 00:53.5 (Left: Eye)     Patient location during evaluation: PACU Anesthesia Type: MAC Level of consciousness: awake and alert Pain management: pain level controlled Vital Signs Assessment: post-procedure vital signs reviewed and stable Respiratory status: spontaneous breathing, nonlabored ventilation, respiratory function stable and patient connected to nasal cannula oxygen Cardiovascular status: stable and blood pressure returned to baseline Postop Assessment: no apparent nausea or vomiting Anesthetic complications: no   There were no known notable events for this encounter.  Arita Miss

## 2021-12-02 NOTE — Anesthesia Preprocedure Evaluation (Signed)
Anesthesia Evaluation  Patient identified by MRN, date of birth, ID band Patient awake    Reviewed: Allergy & Precautions, H&P , NPO status , Patient's Chart, lab work & pertinent test results, reviewed documented beta blocker date and time   History of Anesthesia Complications Negative for: history of anesthetic complications  Airway Mallampati: II  TM Distance: >3 FB Neck ROM: full    Dental  (+) Chipped   Pulmonary neg pulmonary ROS, neg sleep apnea, neg COPD, Patient abstained from smoking.Not current smoker,    Pulmonary exam normal breath sounds clear to auscultation       Cardiovascular Exercise Tolerance: Good METShypertension, Pt. on medications (-) CAD and (-) Past MI Normal cardiovascular exam(-) dysrhythmias  Rhythm:Regular Rate:Normal - Systolic murmurs    Neuro/Psych negative neurological ROS  negative psych ROS   GI/Hepatic neg GERD  ,(+)     (-) substance abuse  ,   Endo/Other  neg diabetesHypothyroidism   Renal/GU negative Renal ROS     Musculoskeletal  (+) Arthritis ,   Abdominal   Peds  Hematology   Anesthesia Other Findings Past Medical History: No date: Acquired hypothyroidism No date: Allergy No date: Arthritis  Past Surgical History: No date: CHOLECYSTECTOMY No date: VARICOSE VEIN SURGERY; Left  BMI    Body Mass Index: 28.29 kg/m      Reproductive/Obstetrics                             Anesthesia Physical  Anesthesia Plan  ASA: 2  Anesthesia Plan: MAC   Post-op Pain Management:    Induction: Intravenous  PONV Risk Score and Plan: 2 and Midazolam  Airway Management Planned: Nasal Cannula  Additional Equipment:   Intra-op Plan:   Post-operative Plan:   Informed Consent: I have reviewed the patients History and Physical, chart, labs and discussed the procedure including the risks, benefits and alternatives for the proposed anesthesia with  the patient or authorized representative who has indicated his/her understanding and acceptance.       Plan Discussed with: CRNA and Surgeon  Anesthesia Plan Comments: (Explained risks of anesthesia, including PONV, and rare emergencies such as cardiac events, respiratory problems, and allergic reactions, requiring invasive intervention. Discussed the role of CRNA in patient's perioperative care. Patient understands. )        Anesthesia Quick Evaluation

## 2021-12-02 NOTE — H&P (Signed)
[facility]   Primary Care Physician:  Idelle Crouch, MD Ophthalmologist: Dr. Leandrew Koyanagi  Pre-Procedure History & Physical: HPI:  Madeline Green is a 71 y.o. female here for ophthalmic surgery.   Past Medical History:  Diagnosis Date   Acquired hypothyroidism    Allergy    Arthritis    History of blood clots    White coat syndrome without diagnosis of hypertension 02/08/2019    Past Surgical History:  Procedure Laterality Date   CHOLECYSTECTOMY     COLONOSCOPY WITH PROPOFOL N/A 04/22/2020   Procedure: COLONOSCOPY WITH PROPOFOL;  Surgeon: Lesly Rubenstein, MD;  Location: [facility] ENDOS[facility];  Service: Endoscopy;  Laterality: N/A;   VARICOSE VEIN SURGERY Left     Prior to Admission medications   Medication Sig Start Date End Date Taking? Authorizing Provider  CALCIUM-MAGNESIUM PO Take by mouth.   Yes [provider]  Cholecalciferol (VITAMIN D) 125 MCG (5000 UT) CAPS    Yes [provider]  cyanocobalamin 1000 MCG tablet Take by mouth.   Yes [provider]  Folic Acid-Cholecalciferol 03-4998 MG-UNIT TABS Take by mouth.   Yes [provider]  levothyroxine (SYNTHROID) 100 MCG tablet Take 100 mcg by mouth daily before breakfast.   Yes [provider]  Magnesium 400 MG CAPS    Yes [provider]  aspirin EC 81 MG tablet Take 81 mg by mouth daily. Swallow whole. Patient not taking: Reported on 11/23/2021    [provider]  cephALEXin (KEFLEX) 500 MG capsule Take 1 capsule (500 mg total) by mouth 2 (two) times daily. Patient not taking: Reported on 11/23/2021 06/10/21   Chevis Pretty, FNP  COVID-19 mRNA bivalent vaccine, Pfizer, (PFIZER COVID-19 VAC BIVALENT) injection Inject into the muscle. 02/06/21   Carlyle Basques, MD  [facility] Extract 95 % POWD by Does not apply route. Patient not taking: Reported on 11/23/2021    [provider]    Allergies as of 10/19/2021 - Review Complete  06/10/2021  Allergen Reaction Noted   Betadine [povidone iodine] Hives 01/25/2019    Family History  Problem Relation Age of Onset   Alcohol abuse Mother    Hypertension Mother    Stroke Mother    Varicose Veins Mother    Alcohol abuse Father    Heart disease Father    Diabetes Father     Social History   Socioeconomic History   Marital status: Married    Spouse name: Not on file   Number of children: Not on file   Years of education: Not on file   Highest education level: Not on file  Occupational History   Not on file  Tobacco Use   Smoking status: Never   Smokeless tobacco: Never  Substance and Sexual Activity   Alcohol use: Yes    Alcohol/week: 10.0 standard drinks of alcohol    Types: 10 Glasses of wine per week   Drug use: No   Sexual activity: Yes    Partners: Male  Other Topics Concern   Not on file  Social History Narrative   Not on file   Social Determinants of Health   Financial Resource Strain: Not on file  Food Insecurity: Not on file  Transportation Needs: Not on file  Physical Activity: Not on file  Stress: Not on file  Social Connections: Not on file  Intimate Partner Violence: Not on file    Review of Systems: See HPI, otherwise negative ROS  Physical Exam: BP Marland Kitchen)  128/56   Pulse 89   Temp 97.9 F (36.6 C) (Temporal)   Ht '5\' 3"'$  (1.6 m)   Wt 75.7 kg   SpO2 97%   BMI 29.55 kg/m  General:   Alert,  pleasant and cooperative in NAD Head:  Normocephalic and atraumatic. Lungs:  Clear to auscultation.    Heart:  Regular rate and rhythm.   Impression/Plan: Madeline Green is here for ophthalmic surgery.  Risks, benefits, limitations, and alternatives regarding ophthalmic surgery have been reviewed with the patient.  Questions have been answered.  All parties agreeable.   Leandrew Koyanagi, MD  12/02/2021, 8:05 AM

## 2021-12-03 ENCOUNTER — Encounter: Payer: Self-pay | Admitting: Ophthalmology

## 2021-12-08 DIAGNOSIS — E039 Hypothyroidism, unspecified: Secondary | ICD-10-CM | POA: Diagnosis not present

## 2021-12-13 ENCOUNTER — Emergency Department
Admission: EM | Admit: 2021-12-13 | Discharge: 2021-12-13 | Disposition: A | Discharge status: Home / Self Care | Payer: Medicare HMO | Attending: Emergency Medicine | Admitting: Emergency Medicine

## 2021-12-13 ENCOUNTER — Other Ambulatory Visit: Payer: Self-pay

## 2021-12-13 ENCOUNTER — Encounter: Payer: Self-pay | Admitting: Emergency Medicine

## 2021-12-13 ENCOUNTER — Emergency Department: Payer: Medicare HMO

## 2021-12-13 DIAGNOSIS — R258 Other abnormal involuntary movements: Secondary | ICD-10-CM | POA: Diagnosis not present

## 2021-12-13 DIAGNOSIS — R6889 Other general symptoms and signs: Secondary | ICD-10-CM | POA: Insufficient documentation

## 2021-12-13 DIAGNOSIS — Z20822 Contact with and (suspected) exposure to covid-19: Secondary | ICD-10-CM | POA: Diagnosis not present

## 2021-12-13 DIAGNOSIS — E039 Hypothyroidism, unspecified: Secondary | ICD-10-CM | POA: Insufficient documentation

## 2021-12-13 DIAGNOSIS — R110 Nausea: Secondary | ICD-10-CM | POA: Insufficient documentation

## 2021-12-13 DIAGNOSIS — R531 Weakness: Secondary | ICD-10-CM | POA: Diagnosis not present

## 2021-12-13 DIAGNOSIS — R5383 Other fatigue: Secondary | ICD-10-CM | POA: Diagnosis not present

## 2021-12-13 LAB — COMPREHENSIVE METABOLIC PANEL
ALT: 21 U/L (ref 0–44)
AST: 21 U/L (ref 15–41)
Albumin: 3.4 g/dL — ABNORMAL LOW (ref 3.5–5.0)
Alkaline Phosphatase: 64 U/L (ref 38–126)
Anion gap: 6 (ref 5–15)
BUN: 18 mg/dL (ref 8–23)
CO2: 26 mmol/L (ref 22–32)
Calcium: 9 mg/dL (ref 8.9–10.3)
Chloride: 107 mmol/L (ref 98–111)
Creatinine, Ser: 0.97 mg/dL (ref 0.44–1.00)
GFR, Estimated: >60 mL/min (ref >60)
Glucose, Bld: 98 mg/dL (ref 70–99)
Potassium: 3.9 mmol/L (ref 3.5–5.1)
Sodium: 139 mmol/L (ref 135–145)
Total Bilirubin: 0.9 mg/dL (ref 0.3–1.2)
Total Protein: 6.5 g/dL (ref 6.5–8.1)

## 2021-12-13 LAB — CBC WITH DIFFERENTIAL/PLATELET
Abs Immature Granulocytes: 0.03 10*3/uL (ref 0.00–0.07)
Basophils Absolute: 0 10*3/uL (ref 0.0–0.1)
Basophils Relative: 1 %
Eosinophils Absolute: 0 10*3/uL (ref 0.0–0.5)
Eosinophils Relative: 0 %
HCT: 40.8 % (ref 36.0–46.0)
Hemoglobin: 13.5 g/dL (ref 12.0–15.0)
Immature Granulocytes: 1 %
Lymphocytes Relative: 5 %
Lymphs Abs: 0.3 10*3/uL — ABNORMAL LOW (ref 0.7–4.0)
MCH: 31 pg (ref 26.0–34.0)
MCHC: 33.1 g/dL (ref 30.0–36.0)
MCV: 93.8 fL (ref 80.0–100.0)
Monocytes Absolute: 0.1 10*3/uL (ref 0.1–1.0)
Monocytes Relative: 2 %
Neutro Abs: 5.9 10*3/uL (ref 1.7–7.7)
Neutrophils Relative %: 91 %
Platelets: 212 10*3/uL (ref 150–400)
RBC: 4.35 MIL/uL (ref 3.87–5.11)
RDW: 13.4 % (ref 11.5–15.5)
WBC: 6.4 10*3/uL (ref 4.0–10.5)
nRBC: 0 % (ref 0.0–0.2)

## 2021-12-13 LAB — URINALYSIS, ROUTINE W REFLEX MICROSCOPIC
Bilirubin Urine: NEGATIVE
Glucose, UA: NEGATIVE mg/dL
Hgb urine dipstick: NEGATIVE
Ketones, ur: NEGATIVE mg/dL
Leukocytes,Ua: NEGATIVE
Nitrite: POSITIVE — AB
Protein, ur: NEGATIVE mg/dL
Specific Gravity, Urine: 1.012 (ref 1.005–1.030)
pH: 5 (ref 5.0–8.0)

## 2021-12-13 LAB — SARS CORONAVIRUS 2 BY RT PCR: SARS Coronavirus 2 by RT PCR: NEGATIVE

## 2021-12-13 LAB — LACTIC ACID, PLASMA: Lactic Acid, Venous: 1 mmol/L (ref 0.5–1.9)

## 2021-12-13 LAB — PROCALCITONIN: Procalcitonin: <0.1 ng/mL

## 2021-12-13 LAB — TSH: TSH: 0.044 u[IU]/mL — ABNORMAL LOW (ref 0.350–4.500)

## 2021-12-13 LAB — MAGNESIUM: Magnesium: 1.7 mg/dL (ref 1.7–2.4)

## 2021-12-13 LAB — LIPASE, BLOOD: Lipase: 38 U/L (ref 11–51)

## 2021-12-13 LAB — TROPONIN I (HIGH SENSITIVITY)
Troponin I (High Sensitivity): 7 ng/L (ref <18)
Troponin I (High Sensitivity): 5 ng/L (ref <18)

## 2021-12-13 LAB — T4, FREE: Free T4: 1.49 ng/dL — ABNORMAL HIGH (ref 0.61–1.12)

## 2021-12-13 MED ORDER — SODIUM CHLORIDE 0.9 % IV BOLUS (SEPSIS): 1000.0000 mL | Freq: Once | INTRAVENOUS | Status: DC

## 2021-12-13 MED ORDER — ONDANSETRON HCL 4 MG/2ML IJ SOLN: 4.0000 mg | Freq: Once | INTRAMUSCULAR | Status: DC

## 2021-12-13 NOTE — ED Notes (Signed)
Pt brought to room 5, this RN assumed care.

## 2021-12-13 NOTE — ED Triage Notes (Signed)
Pt to ED via POV with c/o feeling shakey and nauseated upon awakening. Pt states feels weak and possibly has a fever. Pt states "last time [she] felt like this it was sepsis". Pt A&O x4, NAD noted on arrival. Pt states en route to ED feeling of being weak and shakey has eased off and patient is feeling better.

## 2021-12-13 NOTE — ED Provider Notes (Signed)
[facility] Provider Note    Event Date/Time   First MD Initiated Contact with Patient 12/13/21 [phone]     (approximate)   History   Weakness and Nausea   HPI  Madeline Green is a 71 y.o. female history of hypothyroidism who presents to the emergency department with complaint of shaking chills when she woke up at 4 AM.  No fever but felt weak and nauseated.  States she had some pain behind her left ear but no ear pain, sore throat, dental pain, chest pain, shortness of breath, cough, vomiting, diarrhea, abdominal pain, dysuria.  No known sick contacts.  She states when she had similar symptoms before she had a fever and had a "colon infection".  She states that her doctor recently cut her thyroid pill down.   History provided by patient and husband.    Past Medical History:  Diagnosis Date   Acquired hypothyroidism    Allergy    Arthritis    History of blood clots    White coat syndrome without diagnosis of hypertension 02/08/2019    Past Surgical History:  Procedure Laterality Date   CATARACT EXTRACTION W/PHACO Left 12/02/2021   Procedure: CATARACT EXTRACTION PHACO AND INTRAOCULAR LENS PLACEMENT (IOC) LEFT 7.46 00:53.5;  Surgeon: Leandrew Koyanagi, MD;  Location: [place];  Service: Ophthalmology;  Laterality: Left;   CHOLECYSTECTOMY     COLONOSCOPY WITH PROPOFOL N/A 04/22/2020   Procedure: COLONOSCOPY WITH PROPOFOL;  Surgeon: Lesly Rubenstein, MD;  Location: [facility];  Service: Endoscopy;  Laterality: N/A;   VARICOSE VEIN SURGERY Left     MEDICATIONS:  Prior to Admission medications   Medication Sig Start Date End Date Taking? Authorizing Provider  aspirin EC 81 MG tablet Take 81 mg by mouth daily. Swallow whole. Patient not taking: Reported on 11/23/2021    [provider]  CALCIUM-MAGNESIUM PO Take by mouth.    [provider]  cephALEXin (KEFLEX) 500 MG capsule Take 1 capsule (500 mg total) by mouth 2  (two) times daily. Patient not taking: Reported on 11/23/2021 06/10/21   Chevis Pretty, FNP  Cholecalciferol (VITAMIN D) 125 MCG (5000 UT) CAPS     [provider]  COVID-19 mRNA bivalent vaccine, Pfizer, (PFIZER COVID-19 VAC BIVALENT) injection Inject into the muscle. 02/06/21   Carlyle Basques, MD  cyanocobalamin 1000 MCG tablet Take by mouth.    [provider]  Folic Acid-Cholecalciferol 03-4998 MG-UNIT TABS Take by mouth.    [provider]  levothyroxine (SYNTHROID) 100 MCG tablet Take 100 mcg by mouth daily before breakfast.    [provider]  Magnesium 400 MG CAPS     [provider]  [facility] Extract 95 % POWD by Does not apply route. Patient not taking: Reported on 11/23/2021    [provider]    Physical Exam   Triage Vital Signs: ED Triage Vitals  Enc Vitals Group     BP 12/13/21 0517 (!) 169/85     Pulse Rate 12/13/21 0517 67     Resp 12/13/21 0517 16     Temp 12/13/21 0517 98.4 F (36.9 C)     Temp Source 12/13/21 0517 Oral     SpO2 12/13/21 0517 97 %     Weight --      Height --      Head Circumference --      Peak Flow --      Pain Score 12/13/21 0505 0  Pain Loc --      Pain Edu? --      Excl. in [place]? --     Most recent vital signs: Vitals:   12/13/21 0517 12/13/21 0527  BP: (!) 169/85   Pulse: 67 85  Resp: 16   Temp: 98.4 F (36.9 C) 99.5 F (37.5 C)  SpO2: 97% 98%    CONSTITUTIONAL: Alert and oriented and responds appropriately to questions. Well-appearing; well-nourished HEAD: Normocephalic, atraumatic EYES: Conjunctivae clear, pupils appear equal, sclera nonicteric ENT: normal nose; moist mucous membranes; No pharyngeal erythema or petechiae, no tonsillar hypertrophy or exudate, no uvular deviation, no unilateral swelling in posterior oropharynx, no trismus or drooling, no muffled voice, normal phonation, no stridor, airway patent.  TMs are clear bilaterally without erythema,  purulence, bulging, perforation, effusion.  No cerumen impaction or sign of foreign body in the external auditory canal. No inflammation, erythema or drainage from the external auditory canal. No signs of mastoiditis. No pain with manipulation of the pinna bilaterally. NECK: Supple, normal ROM, no meningismus CARD: RRR; S1 and S2 appreciated; no murmurs, no clicks, no rubs, no gallops RESP: Normal chest excursion without splinting or tachypnea; breath sounds clear and equal bilaterally; no wheezes, no rhonchi, no rales, no hypoxia or respiratory distress, speaking full sentences ABD/GI: Normal bowel sounds; non-distended; soft, non-tender, no rebound, no guarding, no peritoneal signs BACK: The back appears normal EXT: Normal ROM in all joints; no deformity noted, no edema; no cyanosis SKIN: Normal color for age and race; warm; no rash on exposed skin NEURO: Moves all extremities equally, normal speech, no facial asymmetry PSYCH: The patient's mood and manner are appropriate.   ED Results / Procedures / Treatments   LABS: (all labs ordered are listed, but only abnormal results are displayed) Labs Reviewed  CBC WITH DIFFERENTIAL/PLATELET - Abnormal; Notable for the following components:      Result Value   Lymphs Abs 0.3 (*)    All other components within normal limits  COMPREHENSIVE METABOLIC PANEL - Abnormal; Notable for the following components:   Albumin 3.4 (*)    All other components within normal limits  TSH - Abnormal; Notable for the following components:   TSH 0.044 (*)    All other components within normal limits  SARS CORONAVIRUS 2 BY RT PCR  LIPASE, BLOOD  PROCALCITONIN  LACTIC ACID, PLASMA  MAGNESIUM  URINALYSIS, ROUTINE W REFLEX MICROSCOPIC  T4, FREE  TROPONIN I (HIGH SENSITIVITY)  TROPONIN I (HIGH SENSITIVITY)     EKG:  EKG Interpretation  Date/Time:  Sunday December 13 2021 06:21:15 EDT Ventricular Rate:  75 PR Interval:    QRS Duration: 102 QT  Interval:  398 QTC Calculation: 444 R Axis:   -12 Text Interpretation: Normal sinus rhythm frequent pvc in bigeminy Confirmed by Pryor Curia [phone]) on 12/13/2021 6:26:54 AM         RADIOLOGY: My personal review and interpretation of imaging:  CXR clear.  I have personally reviewed all radiology reports.   DG Chest Portable 1 View  Result Date: 12/13/2021 CLINICAL DATA:  Nausea and possible fever. EXAM: PORTABLE CHEST 1 VIEW COMPARISON:  02/08/2014 FINDINGS: The lungs are clear without focal pneumonia, edema, pneumothorax or pleural effusion. Cardiopericardial silhouette is at upper limits of normal for size. The visualized bony structures of the thorax are unremarkable. IMPRESSION: No active disease. Electronically Signed   By: Misty Stanley M.D.   On: 12/13/2021 06:35     PROCEDURES:  Critical Care performed: No      .  1-3 Lead EKG Interpretation  Performed by: Jashon Ishida, Delice Bison, DO Authorized by: Alessa Mazur, Delice Bison, DO     Interpretation: normal     ECG rate:  85   ECG rate assessment: normal     Rhythm: sinus rhythm     Ectopy: none     Conduction: normal       IMPRESSION / MDM / ASSESSMENT AND PLAN / ED COURSE  I reviewed the triage vital signs and the nursing notes.    Patient here with shaking chills, weakness, nausea and pain behind her left ear.  The patient is on the cardiac monitor to evaluate for evidence of arrhythmia and/or significant heart rate changes.   DIFFERENTIAL DIAGNOSIS (includes but not limited to):   Fever, sepsis, bacteremia, no signs of otitis media, otitis externa or mastoiditis.  Differential also includes ACS, dehydration, anemia, electrolyte derangement, thyroid dysfunction.   Patient's presentation is most consistent with acute presentation with potential threat to life or bodily function.   PLAN: Rectal temp here is 99.5.  Otherwise hemodynamically stable and has no rigors currently.  We will obtain CBC, CMP, TSH, troponin x2,  procalcitonin, lactic, urinalysis, EKG, chest x-ray.  She states she had similar symptoms when she has had "a colon infection" before but she has no abdominal pain, abdominal tenderness, vomiting or diarrhea.  No indication currently for CT imaging of her abdomen but will continue to monitor closely.  I do not think this was a seizure as she states there was no loss of consciousness.   MEDICATIONS GIVEN IN ED: Medications - No data to display    ED COURSE: Refusing IV fluids and Zofran.  Show no leukocytosis or leukopenia.  Normal electrolytes, LFTs and lipase.  TSH is low.  We will add on free T4.  First troponin negative.  Second pending.  COVID-negative.  Lactic normal.  Chest x-ray reviewed and interpreted by myself and the radiologist and shows no acute abnormality.  Procalcitonin pending.  Urinalysis pending.  Signed out the oncoming ED physician.   CONSULTS: Spoke pending further work-up.   OUTSIDE RECORDS REVIEWED: Reviewed patient's last office visit with Dr. Doy Hutching on 09/01/2021.       FINAL CLINICAL IMPRESSION(S) / ED DIAGNOSES   Final diagnoses:  Generalized weakness  Rigors     Rx / DC Orders   ED Discharge Orders     None        Note:  This document was prepared using Dragon voice recognition software and may include unintentional dictation errors.   Quaid Yeakle, Delice Bison, DO 12/13/21 [phone]

## 2021-12-13 NOTE — Discharge Instructions (Addendum)
Your tests today in the ER were all okay.  We do not find a specific cause for your symptoms this morning, but your evaluation is generally reassuring.  Continue taking your home medications, and follow-up with your primary care doctor this week for further evaluation of your symptoms.

## 2021-12-16 LAB — URINE CULTURE: Culture: >=100000 — AB

## 2021-12-24 ENCOUNTER — Telehealth: Payer: Medicare HMO | Admitting: Family Medicine

## 2021-12-24 DIAGNOSIS — Z20818 Contact with and (suspected) exposure to other bacterial communicable diseases: Secondary | ICD-10-CM

## 2021-12-24 DIAGNOSIS — J029 Acute pharyngitis, unspecified: Secondary | ICD-10-CM

## 2021-12-24 MED ORDER — AMOXICILLIN 500 MG PO TABS: 500.0000 mg | ORAL_TABLET | Freq: Two times a day (BID) | ORAL | 0 refills | Status: AC

## 2021-12-24 NOTE — Progress Notes (Signed)

## 2022-01-04 DIAGNOSIS — H2511 Age-related nuclear cataract, right eye: Secondary | ICD-10-CM | POA: Diagnosis not present

## 2022-01-06 ENCOUNTER — Encounter: Payer: Self-pay | Admitting: Ophthalmology

## 2022-01-11 NOTE — Discharge Instructions (Signed)

## 2022-01-13 ENCOUNTER — Ambulatory Visit
Admission: RE | Admit: 2022-01-13 | Discharge: 2022-01-13 | Disposition: A | Discharge status: Home / Self Care | Payer: Medicare HMO | Source: Ambulatory Visit | Attending: Ophthalmology | Admitting: Ophthalmology

## 2022-01-13 ENCOUNTER — Encounter
Admission: RE | Disposition: A | Discharge status: Home / Self Care | Payer: Self-pay | Source: Ambulatory Visit | Attending: Ophthalmology

## 2022-01-13 ENCOUNTER — Ambulatory Visit: Payer: Medicare HMO | Admitting: Anesthesiology

## 2022-01-13 ENCOUNTER — Other Ambulatory Visit: Payer: Self-pay

## 2022-01-13 DIAGNOSIS — H2511 Age-related nuclear cataract, right eye: Secondary | ICD-10-CM | POA: Insufficient documentation

## 2022-01-13 DIAGNOSIS — E039 Hypothyroidism, unspecified: Secondary | ICD-10-CM | POA: Diagnosis not present

## 2022-01-13 HISTORY — PX: CATARACT EXTRACTION W/PHACO: SHX586

## 2022-01-13 SURGERY — PHACOEMULSIFICATION, CATARACT, WITH IOL INSERTION
Anesthesia: Monitor Anesthesia Care | Site: Eye | Laterality: Right

## 2022-01-13 MED ORDER — ARMC OPHTHALMIC DILATING DROPS
1.0000 | OPHTHALMIC | Status: DC | PRN
  Administered 2022-01-13 (×3): 1 via OPHTHALMIC

## 2022-01-13 MED ORDER — SIGHTPATH DOSE#1 BSS IO SOLN
INTRAOCULAR | Status: DC | PRN
  Administered 2022-01-13: 1 mL via INTRAMUSCULAR

## 2022-01-13 MED ORDER — BRIMONIDINE TARTRATE-TIMOLOL 0.2-0.5 % OP SOLN
OPHTHALMIC | Status: DC | PRN
  Administered 2022-01-13: 1 [drp] via OPHTHALMIC

## 2022-01-13 MED ORDER — SIGHTPATH DOSE#1 BSS IO SOLN
INTRAOCULAR | Status: DC | PRN
  Administered 2022-01-13: 63 mL via OPHTHALMIC

## 2022-01-13 MED ORDER — SIGHTPATH DOSE#1 NA HYALUR & NA CHOND-NA HYALUR IO KIT
PACK | INTRAOCULAR | Status: DC | PRN
  Administered 2022-01-13: 1 via OPHTHALMIC

## 2022-01-13 MED ORDER — SIGHTPATH DOSE#1 BSS IO SOLN
INTRAOCULAR | Status: DC | PRN
  Administered 2022-01-13: 15 mL

## 2022-01-13 MED ORDER — TETRACAINE HCL 0.5 % OP SOLN
1.0000 [drp] | OPHTHALMIC | Status: DC | PRN
  Administered 2022-01-13 (×3): 1 [drp] via OPHTHALMIC

## 2022-01-13 MED ORDER — CEFUROXIME OPHTHALMIC INJECTION 1 MG/0.1 ML
INJECTION | OPHTHALMIC | Status: DC | PRN
  Administered 2022-01-13: 0.1 mL via INTRACAMERAL

## 2022-01-13 MED ORDER — MIDAZOLAM HCL 2 MG/2ML IJ SOLN
INTRAMUSCULAR | Status: DC | PRN
  Administered 2022-01-13: 2 mg via INTRAVENOUS

## 2022-01-13 MED ORDER — FENTANYL CITRATE (PF) 100 MCG/2ML IJ SOLN
INTRAMUSCULAR | Status: DC | PRN
  Administered 2022-01-13: 50 ug via INTRAVENOUS

## 2022-01-13 SURGICAL SUPPLY — 10 items
CATARACT SUITE SIGHTPATH (MISCELLANEOUS) ×1 IMPLANT
FEE CATARACT SUITE SIGHTPATH (MISCELLANEOUS) ×1 IMPLANT
GLOVE SRG 8 PF TXTR STRL LF DI (GLOVE) ×1 IMPLANT
GLOVE SURG ENC TEXT LTX SZ7.5 (GLOVE) ×1 IMPLANT
GLOVE SURG UNDER POLY LF SZ8 (GLOVE) ×1
LENS IOL TECNIS EYHANCE 18.0 (Intraocular Lens) IMPLANT
NDL FILTER BLUNT 18X1 1/2 (NEEDLE) ×1 IMPLANT
NEEDLE FILTER BLUNT 18X1 1/2 (NEEDLE) ×1 IMPLANT
SYR 3ML LL SCALE MARK (SYRINGE) ×1 IMPLANT
WATER STERILE IRR 250ML POUR (IV SOLUTION) ×1 IMPLANT

## 2022-01-13 NOTE — Op Note (Signed)
  LOCATION:  [place]   PREOPERATIVE DIAGNOSIS:    Nuclear sclerotic cataract right eye. H25.11   POSTOPERATIVE DIAGNOSIS:  Nuclear sclerotic cataract right eye.     PROCEDURE:  Phacoemusification with posterior chamber intraocular lens placement of the right eye   ULTRASOUND TIME: Procedure(s): CATARACT EXTRACTION PHACO AND INTRAOCULAR LENS PLACEMENT (IOC) RIGHT 11.01 01:17.7 (Right)  LENS:   Implant Name Type Inv. Item Serial No. Manufacturer Lot No. LRB No. Used Action  LENS IOL TECNIS EYHANCE 18.0 - [id number] Intraocular Lens LENS IOL TECNIS EYHANCE 18.0 [id number] [employer]  Right 1 Implanted         SURGEON:  Wyonia Hough, MD   ANESTHESIA:  Topical with tetracaine drops and 2% Xylocaine jelly, augmented with 1% preservative-free intracameral lidocaine.    COMPLICATIONS:  None.   DESCRIPTION OF PROCEDURE:  The patient was identified in the holding room and transported to the operating room and placed in the supine position under the operating microscope.  The right eye was identified as the operative eye and it was prepped and draped in the usual sterile ophthalmic fashion.   A 1 millimeter clear-corneal paracentesis was made at the 12:00 position.  0.5 ml of preservative-free 1% lidocaine was injected into the anterior chamber. The anterior chamber was filled with Viscoat viscoelastic.  A 2.4 millimeter keratome was used to make a near-clear corneal incision at the 9:00 position.  A curvilinear capsulorrhexis was made with a cystotome and capsulorrhexis forceps.  Balanced salt solution was used to hydrodissect and hydrodelineate the nucleus.   Phacoemulsification was then used in stop and chop fashion to remove the lens nucleus and epinucleus.  The remaining cortex was then removed using the irrigation and aspiration handpiece. Provisc was then placed into the capsular bag to distend it for lens placement.  A lens was then injected into the capsular bag.   The remaining viscoelastic was aspirated.   Wounds were hydrated with balanced salt solution.  The anterior chamber was inflated to a physiologic pressure with balanced salt solution.  No wound leaks were noted. Cefuroxime 0.1 ml of a '10mg'$ /ml solution was injected into the anterior chamber for a dose of 1 mg of intracameral antibiotic at the completion of the case.   Timolol and Brimonidine drops were applied to the eye.  The patient was taken to the recovery room in stable condition without complications of anesthesia or surgery.   Endre Coutts 01/13/2022, 11:06 AM

## 2022-01-13 NOTE — H&P (Signed)
[facility]   Primary Care Physician:  Idelle Crouch, MD Ophthalmologist: Dr. Leandrew Koyanagi  Pre-Procedure History & Physical: HPI:  Madeline Green is a 71 y.o. female here for ophthalmic surgery.   Past Medical History:  Diagnosis Date   Acquired hypothyroidism    Allergy    Arthritis    History of blood clots    White coat syndrome without diagnosis of hypertension 02/08/2019    Past Surgical History:  Procedure Laterality Date   CATARACT EXTRACTION W/PHACO Left 12/02/2021   Procedure: CATARACT EXTRACTION PHACO AND INTRAOCULAR LENS PLACEMENT (IOC) LEFT 7.46 00:53.5;  Surgeon: Leandrew Koyanagi, MD;  Location: [place];  Service: Ophthalmology;  Laterality: Left;   CHOLECYSTECTOMY     COLONOSCOPY WITH PROPOFOL N/A 04/22/2020   Procedure: COLONOSCOPY WITH PROPOFOL;  Surgeon: Lesly Rubenstein, MD;  Location: [facility];  Service: Endoscopy;  Laterality: N/A;   VARICOSE VEIN SURGERY Left     Prior to Admission medications   Medication Sig Start Date End Date Taking? Authorizing Provider  Cholecalciferol (VITAMIN D) 125 MCG (5000 UT) CAPS    Yes [provider]  cyanocobalamin 1000 MCG tablet Take by mouth.   Yes [provider]  levothyroxine (SYNTHROID) 100 MCG tablet Take 100 mcg by mouth daily before breakfast.   Yes [provider]  Magnesium 400 MG CAPS    Yes [provider]  COVID-19 mRNA bivalent vaccine, Pfizer, (PFIZER COVID-19 VAC BIVALENT) injection Inject into the muscle. 02/06/21   Carlyle Basques, MD    Allergies as of 12/29/2021 - Review Complete 12/24/2021  Allergen Reaction Noted   Betadine [povidone iodine] Hives 01/25/2019    Family History  Problem Relation Age of Onset   Alcohol abuse Mother    Hypertension Mother    Stroke Mother    Varicose Veins Mother    Alcohol abuse Father    Heart disease Father    Diabetes Father     Social History   Socioeconomic History    Marital status: Married    Spouse name: Not on file   Number of children: Not on file   Years of education: Not on file   Highest education level: Not on file  Occupational History   Not on file  Tobacco Use   Smoking status: Never   Smokeless tobacco: Never  Vaping Use   Vaping Use: Never used  Substance and Sexual Activity   Alcohol use: Yes    Alcohol/week: 10.0 standard drinks of alcohol    Types: 10 Glasses of wine per week   Drug use: No   Sexual activity: Yes    Partners: Male  Other Topics Concern   Not on file  Social History Narrative   Not on file   Social Determinants of Health   Financial Resource Strain: Not on file  Food Insecurity: Not on file  Transportation Needs: Not on file  Physical Activity: Not on file  Stress: Not on file  Social Connections: Not on file  Intimate Partner Violence: Not on file    Review of Systems: See HPI, otherwise negative ROS  Physical Exam: BP (!) 143/83   Pulse (!) 54   Temp 97.9 F (36.6 C) (Temporal)   Resp 16   Ht '5\' 5"'$  (1.651 m)   Wt 76.2 kg   SpO2 97%   BMI 27.96 kg/m  General:   Alert,  pleasant and cooperative in NAD Head:  Normocephalic and atraumatic. Lungs:  Clear to auscultation.  Heart:  Regular rate and rhythm.   Impression/Plan: Willeen Green is here for ophthalmic surgery.  Risks, benefits, limitations, and alternatives regarding ophthalmic surgery have been reviewed with the patient.  Questions have been answered.  All parties agreeable.   Leandrew Koyanagi, MD  01/13/2022, 9:56 AM

## 2022-01-13 NOTE — Anesthesia Preprocedure Evaluation (Addendum)
Anesthesia Evaluation  Patient identified by MRN, date of birth, ID band Patient awake    Reviewed: Allergy & Precautions, NPO status , Patient's Chart, lab work & pertinent test results  History of Anesthesia Complications (+) POST - OP SPINAL HEADACHE and history of anesthetic complications  Airway Mallampati: III  TM Distance: >3 FB Neck ROM: full    Dental  (+) Teeth Intact   Pulmonary neg pulmonary ROS,    Pulmonary exam normal        Cardiovascular Exercise Tolerance: Good Normal cardiovascular exam     Neuro/Psych negative neurological ROS  negative psych ROS   GI/Hepatic negative GI ROS, Neg liver ROS,   Endo/Other  Hypothyroidism   Renal/GU      Musculoskeletal  (+) Arthritis ,   Abdominal   Peds  Hematology negative hematology ROS (+)   Anesthesia Other Findings Past Medical History: No date: Acquired hypothyroidism No date: Allergy No date: Arthritis No date: History of blood clots 02/08/2019: White coat syndrome without diagnosis of hypertension  Past Surgical History: 12/02/2021: CATARACT EXTRACTION W/PHACO; Left     Comment:  Procedure: CATARACT EXTRACTION PHACO AND INTRAOCULAR               LENS PLACEMENT (IOC) LEFT 7.46 00:53.5;  Surgeon:               Leandrew Koyanagi, MD;  Location: [place];              Service: Ophthalmology;  Laterality: Left; No date: CHOLECYSTECTOMY 04/22/2020: COLONOSCOPY WITH PROPOFOL; N/A     Comment:  Procedure: COLONOSCOPY WITH PROPOFOL;  Surgeon:               Lesly Rubenstein, MD;  Location: [facility];                Service: Endoscopy;  Laterality: N/A; No date: VARICOSE VEIN SURGERY; Left  BMI    Body Mass Index: 27.46 kg/m      Reproductive/Obstetrics negative OB ROS                            Anesthesia Physical Anesthesia Plan  ASA: 2  Anesthesia Plan: MAC   Post-op Pain Management:     Induction: Intravenous  PONV Risk Score and Plan: Midazolam and Treatment may vary due to age or medical condition  Airway Management Planned: Natural Airway and Nasal Cannula  Additional Equipment:   Intra-op Plan:   Post-operative Plan:   Informed Consent: I have reviewed the patients History and Physical, chart, labs and discussed the procedure including the risks, benefits and alternatives for the proposed anesthesia with the patient or authorized representative who has indicated his/her understanding and acceptance.     Dental Advisory Given  Plan Discussed with: Anesthesiologist, CRNA and Surgeon  Anesthesia Plan Comments: (Patient consented for risks of anesthesia including but not limited to:  - adverse reactions to medications - damage to eyes, teeth, lips or other oral mucosa - nerve damage due to positioning  - sore throat or hoarseness - Damage to heart, brain, nerves, lungs, other parts of body or loss of life  Patient voiced understanding.)        Anesthesia Quick Evaluation

## 2022-01-13 NOTE — Transfer of Care (Signed)
Immediate Anesthesia Transfer of Care Note  Patient: Madeline Green  Procedure(s) Performed: CATARACT EXTRACTION PHACO AND INTRAOCULAR LENS PLACEMENT (IOC) RIGHT 11.01 01:17.7 (Right: Eye)  Patient Location: PACU  Anesthesia Type:MAC  Level of Consciousness: awake, alert  and oriented  Airway & Oxygen Therapy: Patient Spontanous Breathing  Post-op Assessment: Report given to RN and Post -op Vital signs reviewed and stable  Post vital signs: stable  Last Vitals:  Vitals Value Taken Time  BP    Temp    Pulse 59 01/13/22 1109  Resp    SpO2 96 % 01/13/22 1109  Vitals shown include unvalidated device data.  Last Pain:  Vitals:   01/13/22 0947  TempSrc: Temporal         Complications: No notable events documented.

## 2022-01-13 NOTE — Anesthesia Postprocedure Evaluation (Signed)
Anesthesia Post Note  Patient: Madeline Green  Procedure(s) Performed: CATARACT EXTRACTION PHACO AND INTRAOCULAR LENS PLACEMENT (IOC) RIGHT 11.01 01:17.7 (Right: Eye)  Patient location during evaluation: PACU Anesthesia Type: MAC Level of consciousness: awake and alert Pain management: pain level controlled Vital Signs Assessment: post-procedure vital signs reviewed and stable Respiratory status: spontaneous breathing, nonlabored ventilation, respiratory function stable and patient connected to nasal cannula oxygen Cardiovascular status: stable and blood pressure returned to baseline Postop Assessment: no apparent nausea or vomiting Anesthetic complications: no   No notable events documented.   Last Vitals:  Vitals:   01/13/22 1109 01/13/22 1115  BP: (!) 96/49 103/63  Pulse: (!) 59 (!) 50  Resp: 18 20  Temp: 36.6 C   SpO2: 96% 95%    Last Pain:  Vitals:   01/13/22 0947  TempSrc: Temporal                 Ilene Qua

## 2022-01-14 ENCOUNTER — Encounter: Payer: Self-pay | Admitting: Ophthalmology

## 2022-01-25 ENCOUNTER — Encounter (INDEPENDENT_AMBULATORY_CARE_PROVIDER_SITE_OTHER): Payer: Self-pay

## 2022-02-05 DIAGNOSIS — Z961 Presence of intraocular lens: Secondary | ICD-10-CM | POA: Diagnosis not present

## 2022-03-03 DIAGNOSIS — E782 Mixed hyperlipidemia: Secondary | ICD-10-CM | POA: Diagnosis not present

## 2022-03-03 DIAGNOSIS — L989 Disorder of the skin and subcutaneous tissue, unspecified: Secondary | ICD-10-CM | POA: Diagnosis not present

## 2022-03-03 DIAGNOSIS — Z79899 Other long term (current) drug therapy: Secondary | ICD-10-CM | POA: Diagnosis not present

## 2022-03-03 DIAGNOSIS — E039 Hypothyroidism, unspecified: Secondary | ICD-10-CM | POA: Diagnosis not present

## 2022-03-03 DIAGNOSIS — E559 Vitamin D deficiency, unspecified: Secondary | ICD-10-CM | POA: Diagnosis not present

## 2022-03-03 DIAGNOSIS — E538 Deficiency of other specified B group vitamins: Secondary | ICD-10-CM | POA: Diagnosis not present

## 2022-03-08 DIAGNOSIS — E039 Hypothyroidism, unspecified: Secondary | ICD-10-CM | POA: Diagnosis not present

## 2022-03-08 DIAGNOSIS — E782 Mixed hyperlipidemia: Secondary | ICD-10-CM | POA: Diagnosis not present

## 2022-03-08 DIAGNOSIS — Z79899 Other long term (current) drug therapy: Secondary | ICD-10-CM | POA: Diagnosis not present

## 2022-03-08 DIAGNOSIS — E559 Vitamin D deficiency, unspecified: Secondary | ICD-10-CM | POA: Diagnosis not present

## 2022-04-16 DIAGNOSIS — H04123 Dry eye syndrome of bilateral lacrimal glands: Secondary | ICD-10-CM | POA: Diagnosis not present

## 2022-04-16 DIAGNOSIS — Z961 Presence of intraocular lens: Secondary | ICD-10-CM | POA: Diagnosis not present

## 2022-04-16 DIAGNOSIS — H35371 Puckering of macula, right eye: Secondary | ICD-10-CM | POA: Diagnosis not present

## 2022-04-16 DIAGNOSIS — Z0100 Encounter for examination of eyes and vision without abnormal findings: Secondary | ICD-10-CM | POA: Diagnosis not present

## 2022-04-16 DIAGNOSIS — H40003 Preglaucoma, unspecified, bilateral: Secondary | ICD-10-CM | POA: Diagnosis not present

## 2022-04-19 ENCOUNTER — Encounter: Payer: Self-pay | Admitting: Dermatology

## 2022-04-19 ENCOUNTER — Ambulatory Visit: Payer: Medicare HMO | Admitting: Dermatology

## 2022-04-19 VITALS — BP 140/76 | HR 63

## 2022-04-19 DIAGNOSIS — D229 Melanocytic nevi, unspecified: Secondary | ICD-10-CM

## 2022-04-19 DIAGNOSIS — B360 Pityriasis versicolor: Secondary | ICD-10-CM | POA: Diagnosis not present

## 2022-04-19 DIAGNOSIS — L814 Other melanin hyperpigmentation: Secondary | ICD-10-CM | POA: Diagnosis not present

## 2022-04-19 DIAGNOSIS — L570 Actinic keratosis: Secondary | ICD-10-CM

## 2022-04-19 DIAGNOSIS — L578 Other skin changes due to chronic exposure to nonionizing radiation: Secondary | ICD-10-CM

## 2022-04-19 DIAGNOSIS — L821 Other seborrheic keratosis: Secondary | ICD-10-CM

## 2022-04-19 DIAGNOSIS — D692 Other nonthrombocytopenic purpura: Secondary | ICD-10-CM

## 2022-04-19 DIAGNOSIS — Z1283 Encounter for screening for malignant neoplasm of skin: Secondary | ICD-10-CM | POA: Diagnosis not present

## 2022-04-19 DIAGNOSIS — D1801 Hemangioma of skin and subcutaneous tissue: Secondary | ICD-10-CM

## 2022-04-19 DIAGNOSIS — Z79899 Other long term (current) drug therapy: Secondary | ICD-10-CM

## 2022-04-19 DIAGNOSIS — L720 Epidermal cyst: Secondary | ICD-10-CM | POA: Diagnosis not present

## 2022-04-19 DIAGNOSIS — I8393 Asymptomatic varicose veins of bilateral lower extremities: Secondary | ICD-10-CM

## 2022-04-19 MED ORDER — KETOCONAZOLE 2 % EX SHAM: MEDICATED_SHAMPOO | CUTANEOUS | 3 refills | Status: DC

## 2022-04-19 NOTE — Progress Notes (Signed)
New Patient Visit  Subjective  Madeline Green is a 72 y.o. female who presents for the following: Annual Exam (No personal or family Hx of skin cancer). The patient presents for Total-Body Skin Exam (TBSE) for skin cancer screening and mole check.  The patient has spots, moles and lesions to be evaluated, some may be new or changing and the patient has concerns that these could be cancer. Patient accompanied by husband.   Review of Systems: No other skin or systemic complaints except as noted in HPI or Assessment and Plan.  Objective  Well appearing patient in no apparent distress; mood and affect are within normal limits.  A full examination was performed including scalp, head, eyes, ears, nose, lips, neck, chest, axillae, abdomen, back, buttocks, bilateral upper extremities, bilateral lower extremities, hands, feet, fingers, toes, fingernails, and toenails. All findings within normal limits unless otherwise noted below.  Left Axilla Erythematous scaly patches  left base of neck 1 cm subcutaneous nodule.   left side of face x5 (5) Erythematous thin papules/macules with gritty scale.    Assessment & Plan   Lentigines - Scattered tan macules - Due to sun exposure - Benign-appearing, observe - Recommend daily broad spectrum sunscreen SPF 30+ to sun-exposed areas, reapply every 2 hours as needed. - Call for any changes  Seborrheic Keratoses - Stuck-on, waxy, tan-brown papules and/or plaques  - Benign-appearing - Discussed benign etiology and prognosis. - Observe - Call for any changes  Melanocytic Nevi - Tan-brown and/or pink-flesh-colored symmetric macules and papules - Benign appearing on exam today - Observation - Call clinic for new or changing moles - Recommend daily use of broad spectrum spf 30+ sunscreen to sun-exposed areas.   Hemangiomas - Red papules - Discussed benign nature - Observe - Call for any changes  Actinic Damage - Chronic condition,  secondary to cumulative UV/sun exposure - diffuse scaly erythematous macules with underlying dyspigmentation - Recommend daily broad spectrum sunscreen SPF 30+ to sun-exposed areas, reapply every 2 hours as needed.  - Staying in the shade or wearing long sleeves, sun glasses (UVA+UVB protection) and wide brim hats (4-inch brim around the entire circumference of the hat) are also recommended for sun protection.  - Call for new or changing lesions.  Skin cancer screening performed today.  Varicose Veins/Spider Veins - Dilated blue, purple or red veins at the lower extremities - Reassured - Smaller vessels can be treated by sclerotherapy (a procedure to inject a medicine into the veins to make them disappear) if desired, but the treatment is not covered by insurance. Larger vessels may be covered if symptomatic and we would refer to vascular surgeon if treatment desired.  Purpura - Chronic; persistent and recurrent.  Treatable, but not curable. 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 Condition is symptomatic / bothersome to patient. Not to goal. Start Ketoconazole 2% shampoo 3-4 times per week, lather from neck to waist, leave on 8-10 minutes, rinse well.  ketoconazole (NIZORAL) 2 % shampoo - Left Axilla 3-4 times per week, lather from neck to waist, leave on 8-10 minutes, rinse well.  Epidermal inclusion cyst left base of neck Benign-appearing. Exam most  consistent with an epidermal inclusion cyst. Discussed that a cyst is a benign growth that can grow over time and sometimes get irritated or inflamed. Recommend observation if it is  not bothersome. Discussed option of surgical excision to remove it if it is growing, symptomatic, or other changes noted. Please call for new or changing lesions so they can be evaluated.  AK (actinic keratosis) (5) left side of face x5 Actinic keratoses are precancerous spots that appear secondary to cumulative UV radiation exposure/sun exposure over time. They are chronic with expected duration over 1 year. A portion of actinic keratoses will progress to squamous cell carcinoma of the skin. It is not possible to reliably predict which spots will progress to skin cancer and so treatment is recommended to prevent development of skin cancer.  Recommend daily broad spectrum sunscreen SPF 30+ to sun-exposed areas, reapply every 2 hours as needed.  Recommend staying in the shade or wearing long sleeves, sun glasses (UVA+UVB protection) and wide brim hats (4-inch brim around the entire circumference of the hat). Call for new or changing lesions.  Destruction of lesion - left side of face x5 Complexity: simple   Destruction method: cryotherapy   Informed consent: discussed and consent obtained   Timeout:  patient name, date of birth, surgical site, and procedure verified Lesion destroyed using liquid nitrogen: Yes   Region frozen until ice ball extended beyond lesion: Yes   Outcome: patient tolerated procedure well with no complications   Post-procedure details: wound care instructions given   Additional details:  Prior to procedure, discussed risks of blister formation, small wound, skin dyspigmentation, or rare scar following cryotherapy. Recommend Vaseline ointment to treated areas while healing.   Return in about 6 months (around 10/18/2022) for AK Follow Up, Rash Follow Up.  I, Emelia Salisbury, CMA, am acting as scribe for Sarina Ser, MD. Documentation: I have reviewed the above documentation for accuracy and completeness, and I agree with the above.  Sarina Ser, MD

## 2022-04-19 NOTE — Patient Instructions (Addendum)
Cryotherapy Aftercare  Wash gently with soap and water everyday.   Apply Vaseline and Band-Aid daily until healed.    Start Ketoconazole 2% shampoo 3-4 times per week, lather from neck to waist, leave on 8-10 minutes, rinse well.     Recommend daily broad spectrum sunscreen SPF 30+ to sun-exposed areas, reapply every 2 hours as needed. Call for new or changing lesions.  Staying in the shade or wearing long sleeves, sun glasses (UVA+UVB protection) and wide brim hats (4-inch brim around the entire circumference of the hat) are also recommended for sun protection.    Seborrheic Keratosis  What causes seborrheic keratoses? Seborrheic keratoses are harmless, common skin growths that first appear during adult life.  As time goes by, more growths appear.  Some people may develop a large number of them.  Seborrheic keratoses appear on both covered and uncovered body parts.  They are not caused by sunlight.  The tendency to develop seborrheic keratoses can be inherited.  They vary in color from skin-colored to gray, brown, or even black.  They can be either smooth or have a rough, warty surface.   Seborrheic keratoses are superficial and look as if they were stuck on the skin.  Under the microscope this type of keratosis looks like layers upon layers of skin.  That is why at times the top layer may seem to fall off, but the rest of the growth remains and re-grows.    Treatment Seborrheic keratoses do not need to be treated, but can easily be removed in the office.  Seborrheic keratoses often cause symptoms when they rub on clothing or jewelry.  Lesions can be in the way of shaving.  If they become inflamed, they can cause itching, soreness, or burning.  Removal of a seborrheic keratosis can be accomplished by freezing, burning, or surgery. If any spot bleeds, scabs, or grows rapidly, please return to have it checked, as these can be an indication of a skin cancer.    Melanoma ABCDEs  Melanoma is  the most dangerous type of skin cancer, and is the leading cause of death from skin disease.  You are more likely to develop melanoma if you: Have light-colored skin, light-colored eyes, or red or blond hair Spend a lot of time in the sun Tan regularly, either outdoors or in a tanning bed Have had blistering sunburns, especially during childhood Have a close family member who has had a melanoma Have atypical moles or large birthmarks  Early detection of melanoma is key since treatment is typically straightforward and cure rates are extremely high if we catch it early.   The first sign of melanoma is often a change in a mole or a new dark spot.  The ABCDE system is a way of remembering the signs of melanoma.  A for asymmetry:  The two halves do not match. B for border:  The edges of the growth are irregular. C for color:  A mixture of colors are present instead of an even brown color. D for diameter:  Melanomas are usually (but not always) greater than 43m - the size of a pencil eraser. E for evolution:  The spot keeps changing in size, shape, and color.  Please check your skin once per month between visits. You can use a small mirror in front and a large mirror behind you to keep an eye on the back side or your body.   If you see any new or changing lesions before your next follow-up,  please call to schedule a visit.  Please continue daily skin protection including broad spectrum sunscreen SPF 30+ to sun-exposed areas, reapplying every 2 hours as needed when you're outdoors.   Staying in the shade or wearing long sleeves, sun glasses (UVA+UVB protection) and wide brim hats (4-inch brim around the entire circumference of the hat) are also recommended for sun protection.    Due to recent changes in healthcare laws, you may see results of your pathology and/or laboratory studies on MyChart before the doctors have had a chance to review them. We understand that in some cases there may be  results that are confusing or concerning to you. Please understand that not all results are received at the same time and often the doctors may need to interpret multiple results in order to provide you with the best plan of care or course of treatment. Therefore, we ask that you please give [place] 2 business days to thoroughly review all your results before contacting the office for clarification. Should we see a critical lab result, you will be contacted sooner.   If You Need Anything After Your Visit  If you have any questions or concerns for your doctor, please call our main line at [phone] and press option 4 to reach your doctor's medical assistant. If no one answers, please leave a voicemail as directed and we will return your call as soon as possible. Messages left after 4 pm will be answered the following business day.   You may also send [place] a message via [place]. We typically respond to MyChart messages within 1-2 business days.  For prescription refills, please ask your pharmacy to contact our office. Our fax number is [phone].  If you have an urgent issue when the clinic is closed that cannot wait until the next business day, you can page your doctor at the number below.    Please note that while we do our best to be available for urgent issues outside of office hours, we are not available 24/7.   If you have an urgent issue and are unable to reach [place], you may choose to seek medical care at your doctor's office, retail clinic, urgent care center, or emergency room.  If you have a medical emergency, please immediately call 911 or go to the emergency department.  Pager Numbers  - Dr. Nehemiah Massed: [phone]  - Dr. Laurence Ferrari: [phone]  - Dr. Nicole Kindred: [phone]  In the event of inclement weather, please call our main line at [phone] for an update on the status of any delays or closures.  Dermatology Medication Tips: Please keep the boxes that topical medications come  in in order to help keep track of the instructions about where and how to use these. Pharmacies typically print the medication instructions only on the boxes and not directly on the medication tubes.   If your medication is too expensive, please contact our office at [phone] option 4 or send [place] a message through [place].   We are unable to tell what your co-pay for medications will be in advance as this is different depending on your insurance coverage. However, we may be able to find a substitute medication at lower cost or fill out paperwork to get insurance to cover a needed medication.   If a prior authorization is required to get your medication covered by your insurance company, please allow [place] 1-2 business days to complete this process.  Drug prices often vary depending on where the prescription is filled and  some pharmacies may offer cheaper prices.  The website www.goodrx.com contains coupons for medications through different pharmacies. The prices here do not account for what the cost may be with help from insurance (it may be cheaper with your insurance), but the website can give you the price if you did not use any insurance.  - You can print the associated coupon and take it with your prescription to the pharmacy.  - You may also stop by our office during regular business hours and pick up a GoodRx coupon card.  - If you need your prescription sent electronically to a different pharmacy, notify our office through [facility] or by phone at [phone] option 4.     Si Usted Necesita Algo Despus de Su Visita  Tambin puede enviarnos un mensaje a travs de Pharmacist, community. Por lo general respondemos a los mensajes de MyChart en el transcurso de 1 a 2 das hbiles.  Para renovar recetas, por favor pida a su farmacia que se ponga en contacto con nuestra oficina. Harland Dingwall de fax es [place] [phone].  Si tiene un asunto urgente cuando la clnica est cerrada y que no puede  esperar hasta el siguiente da hbil, puede llamar/localizar a su doctor(a) al nmero que aparece a continuacin.   Por favor, tenga en cuenta que aunque hacemos todo lo posible para estar disponibles para asuntos urgentes fuera del horario de [place], no estamos disponibles las 24 horas del da, los 7 das de la [place].   Si tiene un problema urgente y no puede comunicarse con nosotros, puede optar por buscar atencin mdica  en el consultorio de su doctor(a), en una clnica privada, en un centro de atencin urgente o en una sala de emergencias.  Si tiene Engineering geologist, por favor llame inmediatamente al 911 o vaya a la sala de emergencias.  Nmeros de bper  - Dr. Nehemiah Massed: [phone]  - Dra. Moye: [phone]  - Dra. Nicole Kindred: [phone]  En caso de inclemencias del [place], por favor llame a Johnsie Kindred principal al [phone] para una actualizacin sobre el [place] de cualquier retraso o cierre.  Consejos para la medicacin en dermatologa: Por favor, guarde las cajas en las que vienen los medicamentos de uso tpico para ayudarle a seguir las instrucciones sobre dnde y cmo usarlos. Las farmacias generalmente imprimen las instrucciones del medicamento slo en las cajas y no directamente en los tubos del [place].   Si su medicamento es muy caro, por favor, pngase en contacto con Zigmund Daniel llamando al [phone] y presione la opcin 4 o envenos un mensaje a travs de Pharmacist, community.   No podemos decirle cul ser su copago por los medicamentos por adelantado ya que esto es diferente dependiendo de la cobertura de su seguro. Sin embargo, es posible que podamos encontrar un medicamento sustituto a Electrical engineer un formulario para que el seguro cubra el medicamento que se considera necesario.   Si se requiere una autorizacin previa para que su compaa de seguros [place] su medicamento, por favor permtanos de 1 a 2 das hbiles para completar este proceso.  Los  precios de los medicamentos varan con frecuencia dependiendo del Environmental consultant de dnde se surte la receta y alguna farmacias pueden ofrecer precios ms baratos.  El sitio web www.goodrx.com tiene cupones para medicamentos de Airline pilot. Los precios aqu no tienen en cuenta lo que podra costar con la ayuda del seguro (puede ser ms barato con su seguro), pero el sitio web puede darle el precio  ningn seguro.  - Puede imprimir el cupn correspondiente y llevarlo con su receta a la farmacia.  - Tambin puede pasar por nuestra oficina durante el horario de atencin regular y recoger una tarjeta de cupones de GoodRx.  - Si necesita que su receta se enve electrnicamente a una farmacia diferente, informe a nuestra oficina a travs de MyChart de [place] o por telfono llamando al [phone] y presione la opcin 4.  

## 2022-04-28 ENCOUNTER — Encounter: Payer: Self-pay | Admitting: Dermatology

## 2022-05-18 ENCOUNTER — Telehealth: Payer: Medicare HMO | Admitting: Physician Assistant

## 2022-05-18 DIAGNOSIS — R3989 Other symptoms and signs involving the genitourinary system: Secondary | ICD-10-CM | POA: Diagnosis not present

## 2022-05-18 MED ORDER — CEPHALEXIN 500 MG PO CAPS: 500.0000 mg | ORAL_CAPSULE | Freq: Two times a day (BID) | ORAL | 0 refills | Status: DC

## 2022-05-18 NOTE — Progress Notes (Signed)

## 2022-06-08 DIAGNOSIS — Z79899 Other long term (current) drug therapy: Secondary | ICD-10-CM | POA: Diagnosis not present

## 2022-06-08 DIAGNOSIS — E782 Mixed hyperlipidemia: Secondary | ICD-10-CM | POA: Diagnosis not present

## 2022-06-22 DIAGNOSIS — J309 Allergic rhinitis, unspecified: Secondary | ICD-10-CM | POA: Diagnosis not present

## 2022-06-22 DIAGNOSIS — H9319 Tinnitus, unspecified ear: Secondary | ICD-10-CM | POA: Diagnosis not present

## 2022-09-03 DIAGNOSIS — E079 Disorder of thyroid, unspecified: Secondary | ICD-10-CM | POA: Diagnosis not present

## 2022-09-03 DIAGNOSIS — M79671 Pain in right foot: Secondary | ICD-10-CM | POA: Diagnosis not present

## 2022-09-03 DIAGNOSIS — E785 Hyperlipidemia, unspecified: Secondary | ICD-10-CM | POA: Diagnosis not present

## 2022-09-03 DIAGNOSIS — E559 Vitamin D deficiency, unspecified: Secondary | ICD-10-CM | POA: Diagnosis not present

## 2022-09-03 DIAGNOSIS — E538 Deficiency of other specified B group vitamins: Secondary | ICD-10-CM | POA: Diagnosis not present

## 2022-09-03 DIAGNOSIS — Z79899 Other long term (current) drug therapy: Secondary | ICD-10-CM | POA: Diagnosis not present

## 2022-09-03 DIAGNOSIS — Z0000 Encounter for general adult medical examination without abnormal findings: Secondary | ICD-10-CM | POA: Diagnosis not present

## 2022-09-09 DIAGNOSIS — R8290 Unspecified abnormal findings in urine: Secondary | ICD-10-CM | POA: Diagnosis not present

## 2022-09-09 DIAGNOSIS — E782 Mixed hyperlipidemia: Secondary | ICD-10-CM | POA: Diagnosis not present

## 2022-09-09 DIAGNOSIS — Z79899 Other long term (current) drug therapy: Secondary | ICD-10-CM | POA: Diagnosis not present

## 2022-09-09 DIAGNOSIS — E538 Deficiency of other specified B group vitamins: Secondary | ICD-10-CM | POA: Diagnosis not present

## 2022-09-09 DIAGNOSIS — E039 Hypothyroidism, unspecified: Secondary | ICD-10-CM | POA: Diagnosis not present

## 2022-09-13 DIAGNOSIS — M76811 Anterior tibial syndrome, right leg: Secondary | ICD-10-CM | POA: Diagnosis not present

## 2022-09-13 DIAGNOSIS — M2141 Flat foot [pes planus] (acquired), right foot: Secondary | ICD-10-CM | POA: Diagnosis not present

## 2022-09-13 DIAGNOSIS — M79671 Pain in right foot: Secondary | ICD-10-CM | POA: Diagnosis not present

## 2022-09-13 DIAGNOSIS — M2142 Flat foot [pes planus] (acquired), left foot: Secondary | ICD-10-CM | POA: Diagnosis not present

## 2022-09-13 DIAGNOSIS — M216X2 Other acquired deformities of left foot: Secondary | ICD-10-CM | POA: Diagnosis not present

## 2022-09-13 DIAGNOSIS — M216X1 Other acquired deformities of right foot: Secondary | ICD-10-CM | POA: Diagnosis not present

## 2022-10-14 ENCOUNTER — Ambulatory Visit: Payer: Medicare HMO | Admitting: Dermatology

## 2022-11-15 ENCOUNTER — Ambulatory Visit: Payer: Medicare HMO | Admitting: Dermatology

## 2022-11-15 DIAGNOSIS — L821 Other seborrheic keratosis: Secondary | ICD-10-CM

## 2022-11-15 DIAGNOSIS — B360 Pityriasis versicolor: Secondary | ICD-10-CM | POA: Diagnosis not present

## 2022-11-15 DIAGNOSIS — Z79899 Other long term (current) drug therapy: Secondary | ICD-10-CM

## 2022-11-15 DIAGNOSIS — L729 Follicular cyst of the skin and subcutaneous tissue, unspecified: Secondary | ICD-10-CM

## 2022-11-15 DIAGNOSIS — L578 Other skin changes due to chronic exposure to nonionizing radiation: Secondary | ICD-10-CM

## 2022-11-15 DIAGNOSIS — L570 Actinic keratosis: Secondary | ICD-10-CM | POA: Diagnosis not present

## 2022-11-15 DIAGNOSIS — Z7189 Other specified counseling: Secondary | ICD-10-CM | POA: Diagnosis not present

## 2022-11-15 DIAGNOSIS — L720 Epidermal cyst: Secondary | ICD-10-CM | POA: Diagnosis not present

## 2022-11-15 DIAGNOSIS — L814 Other melanin hyperpigmentation: Secondary | ICD-10-CM

## 2022-11-15 DIAGNOSIS — W908XXA Exposure to other nonionizing radiation, initial encounter: Secondary | ICD-10-CM

## 2022-11-15 NOTE — Patient Instructions (Signed)

## 2022-11-15 NOTE — Progress Notes (Signed)
Follow-Up Visit   Subjective  Madeline Green is a 72 y.o. female who presents for the following: Recheck Aks of the face, pt states that tinea versicolor cleared with Ketoconazole 2% shampoo.   The patient has spots, moles and lesions to be evaluated, some may be new or changing and the patient may have concern these could be cancer.   The following portions of the chart were reviewed this encounter and updated as appropriate: medications, allergies, medical history  Review of Systems:  No other skin or systemic complaints except as noted in HPI or Assessment and Plan.  Objective  Well appearing patient in no apparent distress; mood and affect are within normal limits.   A focused examination was performed of the following areas: the face, arms, hands, and chest   Relevant exam findings are noted in the Assessment and Plan.  Chest x 2, L cheek/zygoma x 1 (3) Erythematous thin papules/macules with gritty scale.     Assessment & Plan   Tinea Versicolor  Chronic condition with duration or expected duration over one year. Currently well-controlled.  Continue Ketoconazole 2% shampoo 3-4 times per week, lather from neck to waist, leave on 8-10 minutes, rinse well.    Tinea versicolor is a chronic recurrent skin rash causing discolored scaly spots most commonly seen on back, chest, and/or shoulders.  It is generally asymptomatic. The rash is due to overgrowth of a common type of yeast present on everyone's skin and it is not contagious.  It tends to flare more in the summer due to increased sweating on trunk.  After rash is treated, the scaliness will resolve, but the discoloration will take longer to return to normal pigmentation. The periodic use of an OTC medicated soap/shampoo with zinc or selenium sulfide can be helpful to prevent yeast overgrowth and recurrence.  ACTINIC DAMAGE - chronic, secondary to cumulative UV radiation exposure/sun exposure over time - diffuse scaly  erythematous macules with underlying dyspigmentation - Recommend daily broad spectrum sunscreen SPF 30+ to sun-exposed areas, reapply every 2 hours as needed.  - Recommend staying in the shade or wearing long sleeves, sun glasses (UVA+UVB protection) and wide brim hats (4-inch brim around the entire circumference of the hat). - Call for new or changing lesions.  LENTIGINES Exam: scattered tan macules Due to sun exposure Treatment Plan: Benign-appearing, observe. Recommend daily broad spectrum sunscreen SPF 30+ to sun-exposed areas, reapply every 2 hours as needed.  Call for any changes  SEBORRHEIC KERATOSIS - Stuck-on, waxy, tan-brown papules and/or plaques  - Benign-appearing - Discussed benign etiology and prognosis. - Observe - Call for any changes  EPIDERMAL INCLUSION CYST Exam: Subcutaneous nodule at L clavicle 0.6 cm  Benign-appearing. Exam most consistent with an epidermal inclusion cyst. Discussed that a cyst is a benign growth that can grow over time and sometimes get irritated or inflamed. Recommend observation if it is not bothersome. Discussed option of surgical excision to remove it if it is growing, symptomatic, or other changes noted. Please call for new or changing lesions so they can be evaluated.  AK (actinic keratosis) (3) Chest x 2, L cheek/zygoma x 1  Destruction of lesion - Chest x 2, L cheek/zygoma x 1 (3) Complexity: simple   Destruction method: cryotherapy   Informed consent: discussed and consent obtained   Timeout:  patient name, date of birth, surgical site, and procedure verified Lesion destroyed using liquid nitrogen: Yes   Region frozen until ice ball extended beyond lesion: Yes  Outcome: patient tolerated procedure well with no complications   Post-procedure details: wound care instructions given    Actinic skin damage  Lentigo  Seborrheic keratosis  Cyst of skin  Tinea versicolor  Related Medications ketoconazole (NIZORAL) 2 %  shampoo 3-4 times per week, lather from neck to waist, leave on 8-10 minutes, rinse well.  Medication management  Counseling and coordination of care   Return in about 1 year (around 11/15/2023) for TBSE.  Maylene Roes, CMA, am acting as scribe for Armida Sans, MD .   Documentation: I have reviewed the above documentation for accuracy and completeness, and I agree with the above.  Armida Sans, MD

## 2022-11-22 ENCOUNTER — Encounter: Payer: Self-pay | Admitting: Dermatology

## 2023-01-26 ENCOUNTER — Ambulatory Visit: Payer: Medicare HMO | Admitting: Dermatology

## 2023-02-13 DIAGNOSIS — R300 Dysuria: Secondary | ICD-10-CM | POA: Diagnosis not present

## 2023-03-07 DIAGNOSIS — N189 Chronic kidney disease, unspecified: Secondary | ICD-10-CM | POA: Diagnosis not present

## 2023-03-07 DIAGNOSIS — E559 Vitamin D deficiency, unspecified: Secondary | ICD-10-CM | POA: Diagnosis not present

## 2023-03-07 DIAGNOSIS — Z79899 Other long term (current) drug therapy: Secondary | ICD-10-CM | POA: Diagnosis not present

## 2023-03-07 DIAGNOSIS — R7309 Other abnormal glucose: Secondary | ICD-10-CM | POA: Diagnosis not present

## 2023-03-07 DIAGNOSIS — Z0000 Encounter for general adult medical examination without abnormal findings: Secondary | ICD-10-CM | POA: Diagnosis not present

## 2023-03-07 DIAGNOSIS — E079 Disorder of thyroid, unspecified: Secondary | ICD-10-CM | POA: Diagnosis not present

## 2023-03-07 DIAGNOSIS — E538 Deficiency of other specified B group vitamins: Secondary | ICD-10-CM | POA: Diagnosis not present

## 2023-03-07 DIAGNOSIS — E785 Hyperlipidemia, unspecified: Secondary | ICD-10-CM | POA: Diagnosis not present

## 2023-04-08 DIAGNOSIS — N1831 Chronic kidney disease, stage 3a: Secondary | ICD-10-CM | POA: Diagnosis not present

## 2023-04-12 DIAGNOSIS — H524 Presbyopia: Secondary | ICD-10-CM | POA: Diagnosis not present

## 2023-04-12 DIAGNOSIS — Z961 Presence of intraocular lens: Secondary | ICD-10-CM | POA: Diagnosis not present

## 2023-04-13 ENCOUNTER — Telehealth: Payer: Medicare HMO | Admitting: Physician Assistant

## 2023-04-13 DIAGNOSIS — R3989 Other symptoms and signs involving the genitourinary system: Secondary | ICD-10-CM | POA: Diagnosis not present

## 2023-04-13 MED ORDER — CEPHALEXIN 500 MG PO CAPS
500.0000 mg | ORAL_CAPSULE | Freq: Two times a day (BID) | ORAL | 0 refills | Status: DC
Start: 2023-04-13 — End: 2023-08-10

## 2023-04-13 NOTE — Progress Notes (Signed)

## 2023-06-03 DIAGNOSIS — N1831 Chronic kidney disease, stage 3a: Secondary | ICD-10-CM | POA: Diagnosis not present

## 2023-06-06 DIAGNOSIS — N1831 Chronic kidney disease, stage 3a: Secondary | ICD-10-CM | POA: Diagnosis not present

## 2023-06-06 DIAGNOSIS — Z79899 Other long term (current) drug therapy: Secondary | ICD-10-CM | POA: Diagnosis not present

## 2023-06-06 DIAGNOSIS — R002 Palpitations: Secondary | ICD-10-CM | POA: Diagnosis not present

## 2023-06-06 DIAGNOSIS — R8290 Unspecified abnormal findings in urine: Secondary | ICD-10-CM | POA: Diagnosis not present

## 2023-06-06 DIAGNOSIS — E782 Mixed hyperlipidemia: Secondary | ICD-10-CM | POA: Diagnosis not present

## 2023-06-06 DIAGNOSIS — E039 Hypothyroidism, unspecified: Secondary | ICD-10-CM | POA: Diagnosis not present

## 2023-06-06 DIAGNOSIS — R55 Syncope and collapse: Secondary | ICD-10-CM | POA: Diagnosis not present

## 2023-06-07 DIAGNOSIS — R8290 Unspecified abnormal findings in urine: Secondary | ICD-10-CM | POA: Diagnosis not present

## 2023-06-14 DIAGNOSIS — R002 Palpitations: Secondary | ICD-10-CM | POA: Diagnosis not present

## 2023-06-14 DIAGNOSIS — R55 Syncope and collapse: Secondary | ICD-10-CM | POA: Diagnosis not present

## 2023-06-16 NOTE — Progress Notes (Signed)
 Subjective:    Patient ID: Madeline Green, female    DOB: 03/20/1951, 73 y.o.   MRN: [id number] Chief Complaint  Patient presents with   New Patient (Initial Visit)    ultrasound    The patient is seen for evaluation of symptomatic varicose veins. The patient relates burning and stinging which worsened steadily throughout the course of the day, particularly with standing. The patient also notes an aching and throbbing pain over the varicosities, particularly with prolonged dependent positions. The symptoms are significantly improved with elevation.  The patient also notes that during hot weather the symptoms are greatly intensified. The patient states the pain from the varicose veins interferes with work, daily exercise, shopping and household maintenance. At this point, the symptoms are persistent and severe enough that they're having a negative impact on lifestyle and are interfering with daily activities.  There is no history of DVT, PE or superficial thrombophlebitis. There is no history of ulceration or hemorrhage. The patient denies a significant family history of varicose veins.  The patient has not worn graduated compression in the past. At the present time the patient has not been using over-the-counter analgesics. There is no history of prior surgical intervention or sclerotherapy.  Studies indicate that the patient has no evidence of DVT or superficial phlebitis bilaterally.  No evidence of deep venous insufficiency in the right lower extremity.  There is deep venous insufficiency in the left.  She has reflux in the bilateral great saphenous veins.    Review of Systems  Cardiovascular:  Positive for leg swelling.  All other systems reviewed and are negative.      Objective:   Physical Exam Vitals reviewed.  HENT:     Head: Normocephalic.  Cardiovascular:     Rate and Rhythm: Normal rate.  Pulmonary:     Effort: Pulmonary effort is normal.  Musculoskeletal:         General: Tenderness present.  Skin:    General: Skin is warm and dry.  Neurological:     Mental Status: She is alert and oriented to person, place, and time.  Psychiatric:        Mood and Affect: Mood normal.        Behavior: Behavior normal.        Thought Content: Thought content normal.        Judgment: Judgment normal.     BP 118/76 (BP Location: Left Arm)   Pulse 61   Resp 16   Ht 5\' 4"  (1.626 m)   Wt 168 lb (76.2 kg)   BMI 28.84 kg/m   Past Medical History:  Diagnosis Date   Acquired hypothyroidism    Allergy    Arthritis    History of blood clots    White coat syndrome without diagnosis of hypertension 02/08/2019    Social History   Socioeconomic History   Marital status: Married    Spouse name: Not on file   Number of children: Not on file   Years of education: Not on file   Highest education level: Not on file  Occupational History   Not on file  Tobacco Use   Smoking status: Never   Smokeless tobacco: Never  Vaping Use   Vaping status: Never Used  Substance and Sexual Activity   Alcohol use: Yes    Alcohol/week: 10.0 standard drinks of alcohol    Types: 10 Glasses of wine per week   Drug use: No   Sexual activity: Yes  Partners: Male  Other Topics Concern   Not on file  Social History Narrative   Not on file   Social Drivers of Health   Financial Resource Strain: Low Risk  (03/07/2023)   Received from [facility]   Overall Financial Resource Strain (CARDIA)    Difficulty of Paying Living Expenses: Not hard at all  Food Insecurity: No Food Insecurity (03/07/2023)   Received from [facility]   Hunger Vital Sign    Worried About Running Out of Food in the Last Year: Never true    Ran Out of Food in the Last Year: Never true  Transportation Needs: No Transportation Needs (03/07/2023)   Received from [facility] - Transportation    In the past 12 months, has lack of  transportation kept you from medical appointments or from getting medications?: No    Lack of Transportation (Non-Medical): No  Physical Activity: Not on file  Stress: Not on file  Social Connections: Not on file  Intimate Partner Violence: Not on file    Past Surgical History:  Procedure Laterality Date   CATARACT EXTRACTION W/PHACO Left 12/02/2021   Procedure: CATARACT EXTRACTION PHACO AND INTRAOCULAR LENS PLACEMENT (IOC) LEFT 7.46 00:53.5;  Surgeon: Lockie Mola, MD;  Location: [facility];  Service: Ophthalmology;  Laterality: Left;   CATARACT EXTRACTION W/PHACO Right 01/13/2022   Procedure: CATARACT EXTRACTION PHACO AND INTRAOCULAR LENS PLACEMENT (IOC) RIGHT 11.01 01:17.7;  Surgeon: Lockie Mola, MD;  Location: [facility];  Service: Ophthalmology;  Laterality: Right;   CHOLECYSTECTOMY     COLONOSCOPY WITH PROPOFOL N/A 04/22/2020   Procedure: COLONOSCOPY WITH PROPOFOL;  Surgeon: Regis Bill, MD;  Location: [facility];  Service: Endoscopy;  Laterality: N/A;   VARICOSE VEIN SURGERY Left     Family History  Problem Relation Age of Onset   Alcohol abuse Mother    Hypertension Mother    Stroke Mother    Varicose Veins Mother    Alcohol abuse Father    Heart disease Father    Diabetes Father     Allergies  Allergen Reactions   Betadine [Povidone Iodine] Hives       Latest Ref Rng & Units 12/13/2021    6:00 AM 01/04/2013    8:26 PM 05/05/2012   10:35 AM  CBC  WBC 4.0 - 10.5 K/uL 6.4  6.2  6.6   Hemoglobin 12.0 - 15.0 g/dL 62.1  30.8  65.7   Hematocrit 36.0 - 46.0 % 40.8  39.8  37.6   Platelets 150 - 400 K/uL 212  242  341       CMP     Component Value Date/Time   NA 139 12/13/2021 0600   NA 137 01/04/2013 2026   K 3.9 12/13/2021 0600   K 4.0 01/04/2013 2026   CL 107 12/13/2021 0600   CL 105 01/04/2013 2026   CO2 26 12/13/2021 0600   CO2 28 01/04/2013 2026   GLUCOSE 98 12/13/2021 0600   GLUCOSE 99 01/04/2013 2026   BUN  18 12/13/2021 0600   BUN 14 01/04/2013 2026   CREATININE 0.97 12/13/2021 0600   CREATININE 1.00 01/04/2013 2026   CALCIUM 9.0 12/13/2021 0600   CALCIUM 8.9 01/04/2013 2026   PROT 6.5 12/13/2021 0600   PROT 7.9 03/03/2012 0947   ALBUMIN 3.4 (L) 12/13/2021 0600   ALBUMIN 3.3 (L) 03/03/2012 0947   AST 21 12/13/2021 0600   AST 18 03/03/2012 0947  ALT 21 12/13/2021 0600   ALT 23 03/03/2012 0947   ALKPHOS 64 12/13/2021 0600   ALKPHOS 95 03/03/2012 0947   BILITOT 0.9 12/13/2021 0600   BILITOT 0.5 03/03/2012 0947   GFRNONAA >60 12/13/2021 0600   GFRNONAA >60 01/04/2013 2026     No results found.     Assessment & Plan:   1. Varicose veins of bilateral lower extremities with pain (Primary) Recommend:  The patient is complaining of varicose veins.    I have had a long discussion with the patient regarding  varicose veins and why they cause symptoms.  Patient will begin wearing graduated compression stockings on a daily basis, beginning first thing in the morning and removing them in the evening. The patient is instructed specifically not to sleep in the stockings.    The patient  will also begin using over-the-counter analgesics such as Motrin 600 mg po TID to help control the symptoms as needed.    In addition, behavioral modification including elevation during the day will be initiated, utilizing a recliner was recommended.  The patient is also instructed to continue exercising such as walking 4-5 times per week.  At this time the patient wishes to continue conservative therapy and is not interested in more invasive treatments such as laser ablation and sclerotherapy.  The Patient will follow up PRN if the symptoms worsen.   Current Outpatient Medications on File Prior to Visit  Medication Sig Dispense Refill   Cholecalciferol (VITAMIN D) 125 MCG (5000 UT) CAPS      COVID-19 mRNA bivalent vaccine, Pfizer, (PFIZER COVID-19 VAC BIVALENT) injection Inject into the muscle. 0.3 mL 0    cyanocobalamin 1000 MCG tablet Take by mouth.     Magnesium 400 MG CAPS      No current facility-administered medications on file prior to visit.    There are no Patient Instructions on file for this visit. No follow-ups on file.   Georgiana Spinner, NP

## 2023-06-29 DIAGNOSIS — R12 Heartburn: Secondary | ICD-10-CM | POA: Diagnosis not present

## 2023-06-29 DIAGNOSIS — E782 Mixed hyperlipidemia: Secondary | ICD-10-CM | POA: Diagnosis not present

## 2023-06-29 DIAGNOSIS — I493 Ventricular premature depolarization: Secondary | ICD-10-CM | POA: Diagnosis not present

## 2023-06-29 DIAGNOSIS — R140 Abdominal distension (gaseous): Secondary | ICD-10-CM | POA: Diagnosis not present

## 2023-06-29 DIAGNOSIS — I498 Other specified cardiac arrhythmias: Secondary | ICD-10-CM | POA: Diagnosis not present

## 2023-06-29 DIAGNOSIS — Z0000 Encounter for general adult medical examination without abnormal findings: Secondary | ICD-10-CM | POA: Diagnosis not present

## 2023-06-29 DIAGNOSIS — R001 Bradycardia, unspecified: Secondary | ICD-10-CM | POA: Diagnosis not present

## 2023-06-29 DIAGNOSIS — E039 Hypothyroidism, unspecified: Secondary | ICD-10-CM | POA: Diagnosis not present

## 2023-06-29 DIAGNOSIS — R531 Weakness: Secondary | ICD-10-CM | POA: Diagnosis not present

## 2023-07-12 DIAGNOSIS — R001 Bradycardia, unspecified: Secondary | ICD-10-CM | POA: Diagnosis not present

## 2023-08-09 ENCOUNTER — Ambulatory Visit: Admitting: Dermatology

## 2023-08-10 ENCOUNTER — Ambulatory Visit: Admitting: Dermatology

## 2023-08-10 DIAGNOSIS — Z1283 Encounter for screening for malignant neoplasm of skin: Secondary | ICD-10-CM | POA: Diagnosis not present

## 2023-08-10 DIAGNOSIS — W908XXA Exposure to other nonionizing radiation, initial encounter: Secondary | ICD-10-CM | POA: Diagnosis not present

## 2023-08-10 DIAGNOSIS — D1801 Hemangioma of skin and subcutaneous tissue: Secondary | ICD-10-CM

## 2023-08-10 DIAGNOSIS — L729 Follicular cyst of the skin and subcutaneous tissue, unspecified: Secondary | ICD-10-CM

## 2023-08-10 DIAGNOSIS — L720 Epidermal cyst: Secondary | ICD-10-CM | POA: Diagnosis not present

## 2023-08-10 DIAGNOSIS — L570 Actinic keratosis: Secondary | ICD-10-CM | POA: Diagnosis not present

## 2023-08-10 DIAGNOSIS — L821 Other seborrheic keratosis: Secondary | ICD-10-CM

## 2023-08-10 DIAGNOSIS — Z7189 Other specified counseling: Secondary | ICD-10-CM

## 2023-08-10 DIAGNOSIS — L820 Inflamed seborrheic keratosis: Secondary | ICD-10-CM

## 2023-08-10 DIAGNOSIS — Z5111 Encounter for antineoplastic chemotherapy: Secondary | ICD-10-CM

## 2023-08-10 DIAGNOSIS — B360 Pityriasis versicolor: Secondary | ICD-10-CM

## 2023-08-10 DIAGNOSIS — L814 Other melanin hyperpigmentation: Secondary | ICD-10-CM | POA: Diagnosis not present

## 2023-08-10 DIAGNOSIS — I781 Nevus, non-neoplastic: Secondary | ICD-10-CM

## 2023-08-10 DIAGNOSIS — Z79899 Other long term (current) drug therapy: Secondary | ICD-10-CM

## 2023-08-10 DIAGNOSIS — I8393 Asymptomatic varicose veins of bilateral lower extremities: Secondary | ICD-10-CM

## 2023-08-10 DIAGNOSIS — L578 Other skin changes due to chronic exposure to nonionizing radiation: Secondary | ICD-10-CM | POA: Diagnosis not present

## 2023-08-10 DIAGNOSIS — D229 Melanocytic nevi, unspecified: Secondary | ICD-10-CM

## 2023-08-10 MED ORDER — FLUOROURACIL 5 % EX CREA: TOPICAL_CREAM | CUTANEOUS | 1 refills | Status: AC

## 2023-08-10 NOTE — Patient Instructions (Addendum)
 Start fluorouracil cream - apply thin layer of cream topically to chest twice daily for 2 weeks.   Reviewed course of treatment and expected reaction.  Patient advised to expect inflammation and crusting and advised that erosions are possible.  Patient advised to be diligent with sun protection during and after treatment. Counseled to keep medication out of reach of children and pets.     5-Fluorouracil/Calcipotriene Patient Education   Actinic keratoses are the dry, red scaly spots on the skin caused by sun damage. A portion of these spots can turn into skin cancer with time, and treating them can help prevent development of skin cancer.   Treatment of these spots requires removal of the defective skin cells. There are various ways to remove actinic keratoses, including freezing with liquid nitrogen, treatment with creams, or treatment with a blue light procedure in the office.   5-fluorouracil cream is a topical cream used to treat actinic keratoses. It works by interfering with the growth of abnormal fast-growing skin cells, such as actinic keratoses. These cells peel off and are replaced by healthy ones.   5-fluorouracil/calcipotriene is a combination of the 5-fluorouracil cream with a vitamin D analog cream called calcipotriene. The calcipotriene alone does not treat actinic keratoses. However, when it is combined with 5-fluorouracil, it helps the 5-fluorouracil treat the actinic keratoses much faster so that the same results can be achieved with a much shorter treatment time.  INSTRUCTIONS FOR 5-FLUOROURACIL/CALCIPOTRIENE CREAM:   5-fluorouracil/calcipotriene cream typically only needs to be used for 4-7 days. A thin layer should be applied twice a day to the treatment areas recommended by your physician.   If your physician prescribed you separate tubes of 5-fluourouracil and calcipotriene, apply a thin layer of 5-fluorouracil followed by a thin layer of calcipotriene.   Avoid contact  with your eyes, nostrils, and mouth. Do not use 5-fluorouracil/calcipotriene cream on infected or open wounds.   You will develop redness, irritation and some crusting at areas where you have pre-cancer damage/actinic keratoses. IF YOU DEVELOP PAIN, BLEEDING, OR SIGNIFICANT CRUSTING, STOP THE TREATMENT EARLY - you have already gotten a good response and the actinic keratoses should clear up well.  Wash your hands after applying 5-fluorouracil 5% cream on your skin.   A moisturizer or sunscreen with a minimum SPF 30 should be applied each morning.   Once you have finished the treatment, you can apply a thin layer of Vaseline twice a day to irritated areas to soothe and calm the areas more quickly. If you experience significant discomfort, contact your physician.  For some patients it is necessary to repeat the treatment for best results.  SIDE EFFECTS: When using 5-fluorouracil/calcipotriene cream, you may have mild irritation, such as redness, dryness, swelling, or a mild burning sensation. This usually resolves within 2 weeks. The more actinic keratoses you have, the more redness and inflammation you can expect during treatment. Eye irritation has been reported rarely. If this occurs, please let us  know.  If you have any trouble using this cream, please call the office. If you have any other questions about this information, please do not hesitate to ask me before you leave the office.    Seborrheic Keratosis  What causes seborrheic keratoses? Seborrheic keratoses are harmless, common skin growths that first appear during adult life.  As time goes by, more growths appear.  Some people may develop a large number of them.  Seborrheic keratoses appear on both covered and uncovered body parts.  They are not  caused by sunlight.  The tendency to develop seborrheic keratoses can be inherited.  They vary in color from skin-colored to gray, brown, or even black.  They can be either smooth or have a  rough, warty surface.   Seborrheic keratoses are superficial and look as if they were stuck on the skin.  Under the microscope this type of keratosis looks like layers upon layers of skin.  That is why at times the top layer may seem to fall off, but the rest of the growth remains and re-grows.    Treatment Seborrheic keratoses do not need to be treated, but can easily be removed in the office.  Seborrheic keratoses often cause symptoms when they rub on clothing or jewelry.  Lesions can be in the way of shaving.  If they become inflamed, they can cause itching, soreness, or burning.  Removal of a seborrheic keratosis can be accomplished by freezing, burning, or surgery. If any spot bleeds, scabs, or grows rapidly, please return to have it checked, as these can be an indication of a skin cancer.   Actinic keratoses are precancerous spots that appear secondary to cumulative UV radiation exposure/sun exposure over time. They are chronic with expected duration over 1 year. A portion of actinic keratoses will progress to squamous cell carcinoma of the skin. It is not possible to reliably predict which spots will progress to skin cancer and so treatment is recommended to prevent development of skin cancer.  Recommend daily broad spectrum sunscreen SPF 30+ to sun-exposed areas, reapply every 2 hours as needed.  Recommend staying in the shade or wearing long sleeves, sun glasses (UVA+UVB protection) and wide brim hats (4-inch brim around the entire circumference of the hat). Call for new or changing lesions.   Cryotherapy Aftercare  Wash gently with soap and water everyday.   Apply Vaseline and Band-Aid daily until healed.        Melanoma ABCDEs  Melanoma is the most dangerous type of skin cancer, and is the leading cause of death from skin disease.  You are more likely to develop melanoma if you: Have light-colored skin, light-colored eyes, or red or blond hair Spend a lot of time in the  sun Tan regularly, either outdoors or in a tanning bed Have had blistering sunburns, especially during childhood Have a close family member who has had a melanoma Have atypical moles or large birthmarks  Early detection of melanoma is key since treatment is typically straightforward and cure rates are extremely high if we catch it early.   The first sign of melanoma is often a change in a mole or a new dark spot.  The ABCDE system is a way of remembering the signs of melanoma.  A for asymmetry:  The two halves do not match. B for border:  The edges of the growth are irregular. C for color:  A mixture of colors are present instead of an even brown color. D for diameter:  Melanomas are usually (but not always) greater than 6mm - the size of a pencil eraser. E for evolution:  The spot keeps changing in size, shape, and color.  Please check your skin once per month between visits. You can use a small mirror in front and a large mirror behind you to keep an eye on the back side or your body.   If you see any new or changing lesions before your next follow-up, please call to schedule a visit.  Please continue daily skin protection including  broad spectrum sunscreen SPF 30+ to sun-exposed areas, reapplying every 2 hours as needed when you're outdoors.   Staying in the shade or wearing long sleeves, sun glasses (UVA+UVB protection) and wide brim hats (4-inch brim around the entire circumference of the hat) are also recommended for sun protection.    Due to recent changes in healthcare laws, you may see results of your pathology and/or laboratory studies on MyChart before the doctors have had a chance to review them. We understand that in some cases there may be results that are confusing or concerning to you. Please understand that not all results are received at the same time and often the doctors may need to interpret multiple results in order to provide you with the best plan of care or course of  treatment. Therefore, we ask that you please give us  2 business days to thoroughly review all your results before contacting the office for clarification. Should we see a critical lab result, you will be contacted sooner.   If You Need Anything After Your Visit  If you have any questions or concerns for your doctor, please call our main line at [phone] and press option 4 to reach your doctor's medical assistant. If no one answers, please leave a voicemail as directed and we will return your call as soon as possible. Messages left after 4 pm will be answered the following business day.   You may also send us  a message via MyChart. We typically respond to MyChart messages within 1-2 business days.  For prescription refills, please ask your pharmacy to contact our office. Our fax number is [phone].  If you have an urgent issue when the clinic is closed that cannot wait until the next business day, you can page your doctor at the number below.    Please note that while we do our best to be available for urgent issues outside of office hours, we are not available 24/7.   If you have an urgent issue and are unable to reach us , you may choose to seek medical care at your doctor's office, retail clinic, urgent care center, or emergency room.  If you have a medical emergency, please immediately call 911 or go to the emergency department.  Pager Numbers  - Dr. Bary Likes: [phone]  - Dr. Annette Barters: [phone]  - Dr. Felipe Horton: [phone]   In the event of inclement weather, please call our main line at [phone] for an update on the status of any delays or closures.  Dermatology Medication Tips: Please keep the boxes that topical medications come in in order to help keep track of the instructions about where and how to use these. Pharmacies typically print the medication instructions only on the boxes and not directly on the medication tubes.   If your medication is too expensive,  please contact our office at [phone] option 4 or send us  a message through MyChart.   We are unable to tell what your co-pay for medications will be in advance as this is different depending on your insurance coverage. However, we may be able to find a substitute medication at lower cost or fill out paperwork to get insurance to cover a needed medication.   If a prior authorization is required to get your medication covered by your insurance company, please allow us  1-2 business days to complete this process.  Drug prices often vary depending on where the prescription is filled and some pharmacies may offer cheaper prices.  The website www.goodrx.com contains coupons  for medications through different pharmacies. The prices here do not account for what the cost may be with help from insurance (it may be cheaper with your insurance), but the website can give you the price if you did not use any insurance.  - You can print the associated coupon and take it with your prescription to the pharmacy.  - You may also stop by our office during regular business hours and pick up a GoodRx coupon card.  - If you need your prescription sent electronically to a different pharmacy, notify our office through [facility] or by phone at [phone] option 4.     Si Usted Necesita Algo Despus de Su Visita  Tambin puede enviarnos un mensaje a travs de Clinical cytogeneticist. Por lo general respondemos a los mensajes de MyChart en el transcurso de 1 a 2 das hbiles.  Para renovar recetas, por favor pida a su farmacia que se ponga en contacto con nuestra oficina. Franz Jacks de fax es [place] [phone].  Si tiene un asunto urgente cuando la clnica est cerrada y que no puede esperar hasta el siguiente da hbil, puede llamar/localizar a su doctor(a) al nmero que aparece a continuacin.   Por favor, tenga en cuenta que aunque hacemos todo lo posible para estar disponibles para asuntos urgentes fuera del  horario de [place], no estamos disponibles las 24 horas del da, los 7 [address]  [address] de la [place].   Si tiene un problema urgente y no puede comunicarse con nosotros, puede optar por buscar atencin mdica  en el consultorio de su doctor(a), en una clnica privada, en un centro de atencin urgente o en una sala de emergencias.  Si tiene Engineer, drilling, por favor llame inmediatamente al 911 o vaya a la sala de emergencias.  Nmeros de bper  - Dr. Bary Likes: [phone]  - Dra. Annette Barters: [phone]  - Dr. Felipe Horton: [phone]   En caso de inclemencias del tiempo, por favor llame a Lajuan Pila principal al [phone] para una actualizacin sobre el [place] de cualquier retraso o cierre.  Consejos para la medicacin en dermatologa: Por favor, guarde las cajas en las que vienen los medicamentos de uso tpico para ayudarle a seguir las instrucciones sobre dnde y cmo usarlos. Las farmacias generalmente imprimen las instrucciones del medicamento slo en las cajas y no directamente en los tubos del [place].   Si su medicamento es muy caro, por favor, pngase en contacto con Bettyjane Brunet llamando al [phone] y presione la opcin 4 o envenos un mensaje a travs de Clinical cytogeneticist.   No podemos decirle cul ser su copago por los medicamentos por adelantado ya que esto es diferente dependiendo de la cobertura de su seguro. Sin embargo, es posible que podamos encontrar un medicamento sustituto a Audiological scientist un formulario para que el seguro cubra el medicamento que se considera necesario.   Si se requiere una autorizacin previa para que su compaa de seguros [place] su medicamento, por favor permtanos de 1 a 2 das hbiles para completar este proceso.  Los precios de los medicamentos varan con frecuencia dependiendo del Environmental consultant de dnde se surte la receta y alguna farmacias pueden ofrecer precios ms baratos.  El sitio web www.goodrx.com tiene cupones para medicamentos de Engineer, civil (consulting). Los precios aqu no tienen en cuenta lo que podra costar con la ayuda del seguro (puede ser ms barato con su seguro), pero el sitio web puede darle el precio si no utiliz Tourist information centre manager.  - Puede imprimir el cupn  correspondiente y llevarlo con su receta a la farmacia.  - Tambin puede pasar por nuestra oficina durante el horario de atencin regular y Education officer, museum una tarjeta de cupones de GoodRx.  - Si necesita que su receta se enve electrnicamente a una farmacia diferente, informe a nuestra oficina a travs de MyChart de [place] o por telfono llamando al [phone] y presione la opcin 4.

## 2023-08-10 NOTE — Progress Notes (Unsigned)
 Follow-Up Visit   Subjective  Madeline Green is a 73 y.o. female who presents for the following: Skin Cancer Screening and Full Body Skin Exam Hx of isk,and aks  Spots at chest, face, and spots under arms Back of right leg that keeps coming and going  The patient presents for Total-Body Skin Exam (TBSE) for skin cancer screening and mole check. The patient has spots, moles and lesions to be evaluated, some may be new or changing and the patient may have concern these could be cancer.  The following portions of the chart were reviewed this encounter and updated as appropriate: medications, allergies, medical history  Review of Systems:  No other skin or systemic complaints except as noted in HPI or Assessment and Plan.  Objective  Well appearing patient in no apparent distress; mood and affect are within normal limits.  A full examination was performed including scalp, head, eyes, ears, nose, lips, neck, chest, axillae, abdomen, back, buttocks, bilateral upper extremities, bilateral lower extremities, hands, feet, fingers, toes, fingernails, and toenails. All findings within normal limits unless otherwise noted below.   Relevant physical exam findings are noted in the Assessment and Plan.  left temple x 2 (2) Erythematous thin papules/macules with gritty scale.  left cheek x 4, right lateral canthus x 1, right posterior thigh x 1 (6) Erythematous stuck-on, waxy papule or plaque  Assessment & Plan   SKIN CANCER SCREENING PERFORMED TODAY.  EPIDERMAL INCLUSION CYST Exam: Subcutaneous nodule at L clavicle 1.1 cm   Benign-appearing. Exam most consistent with an epidermal inclusion cyst. Discussed that a cyst is a benign growth that can grow over time and sometimes get irritated or inflamed. Recommend observation if it is not bothersome. Discussed option of surgical excision to remove it if it is growing, symptomatic, or other changes noted. Please call for new or changing lesions so  they can be evaluated.  Varicose Veins/Spider Veins - Dilated blue, purple or red veins at the lower extremities - Reassured - Smaller vessels can be treated by sclerotherapy (a procedure to inject a medicine into the veins to make them disappear) if desired, but the treatment is not covered by insurance. Larger vessels may be covered if symptomatic and we would refer to vascular surgeon if treatment desired.  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 Currently well-controlled. Patient declined refills  Continue Ketoconazole  2% shampoo 3-4 times per week, lather from neck to waist, leave on 8-10 minutes, rinse well.  Tinea versicolor is a chronic recurrent skin rash causing discolored scaly spots most commonly seen on back, chest, and/or shoulders.  It is generally asymptomatic. The rash is due to overgrowth of a common type of yeast present on everyone's skin and it is not contagious.  It tends to flare more in the summer due to increased sweating on trunk.  After rash is treated, the scaliness will resolve, but the discoloration will take longer to return to normal pigmentation. The periodic use of an OTC medicated soap/shampoo with zinc or selenium sulfide can be helpful to prevent yeast overgrowth and recurrence.  LENTIGINES, SEBORRHEIC KERATOSES, HEMANGIOMAS - Benign normal skin lesions - Benign-appearing - Call for any changes Sks under b/l arms  Sks at lower legs   MELANOCYTIC NEVI - Tan-brown and/or pink-flesh-colored symmetric macules and papules - Benign appearing on exam today - Observation - Call clinic for new or changing moles - Recommend daily use of broad spectrum spf 30+ sunscreen to sun-exposed areas.  ACTINIC KERATOSIS (2) left temple x 2 (2) Discussed [facility] - patient was concerned with price and said too expensive  - Start 5-fluorouracil/calcipotriene cream twice a day for 7 days to affected areas including chest  . Prescription sent to [facility]. Patient advised they will receive an email to purchase the medication online and have it sent to their home. Patient provided with handout reviewing treatment course and side effects and advised to call or message us  on MyChart with any concerns.  Start fluorouracil 5 % cream - apply thin layer of cream topically to chest twice daily for 14 days. Sent to [facility] - patient advised to send mychart if medication is too expensive   Reviewed course of treatment and expected reaction.  Patient advised to expect inflammation and crusting and advised that erosions are possible.  Patient advised to be diligent with sun protection during and after treatment. Counseled to keep medication out of reach of children and pets.   ACTINIC DAMAGE WITH PRECANCEROUS ACTINIC KERATOSES Counseling for Topical Chemotherapy Management: Patient exhibits: - Severe, confluent actinic changes with pre-cancerous actinic keratoses that is secondary to cumulative UV radiation exposure over time - Condition that is severe; chronic, not at goal. - diffuse scaly erythematous macules and papules with underlying dyspigmentation - Discussed Prescription "Field Treatment" topical Chemotherapy for Severe, Chronic Confluent Actinic Changes with Pre-Cancerous Actinic Keratoses Field treatment involves treatment of an entire area of skin that has confluent Actinic Changes (Sun/ Ultraviolet light damage) and PreCancerous Actinic Keratoses by method of PhotoDynamic Therapy (PDT) and/or prescription Topical Chemotherapy agents such as 5-fluorouracil, 5-fluorouracil/calcipotriene, and/or imiquimod.  The purpose is to decrease the number of clinically evident and subclinical PreCancerous lesions to prevent progression to development of skin cancer by chemically destroying early precancer changes that may or may not be visible.  It has been shown to reduce the risk  of developing skin cancer in the treated area. As a result of treatment, redness, scaling, crusting, and open sores may occur during treatment course. One or more than one of these methods may be used and may have to be used several times to control, suppress and eliminate the PreCancerous changes. Discussed treatment course, expected reaction, and possible side effects. - Recommend daily broad spectrum sunscreen SPF 30+ to sun-exposed areas, reapply every 2 hours as needed.  - Staying in the shade or wearing long sleeves, sun glasses (UVA+UVB protection) and wide brim hats (4-inch brim around the entire circumference of the hat) are also recommended. - Call for new or changing lesions.  Destruction of lesion - left temple x 2 (2) Complexity: simple   Destruction method: cryotherapy   Informed consent: discussed and consent obtained   Timeout:  patient name, date of birth, surgical site, and procedure verified Lesion destroyed using liquid nitrogen: Yes   Region frozen until ice ball extended beyond lesion: Yes   Outcome: patient tolerated procedure well with no complications   Post-procedure details: wound care instructions given    fluorouracil (EFUDEX) 5 % cream - left temple x 2 (2) Apply thin coat topically to chest twice daily for 2 weeks INFLAMED SEBORRHEIC KERATOSIS (6) left cheek x 4, right lateral canthus x 1, right posterior thigh x 1 (6) Symptomatic, irritating, patient would like treated. Destruction of lesion - left cheek x 4, right lateral canthus x 1, right posterior thigh x 1 (6) Complexity: simple   Destruction method: cryotherapy   Informed consent: discussed and consent  obtained   Timeout:  patient name, date of birth, surgical site, and procedure verified Lesion destroyed using liquid nitrogen: Yes   Region frozen until ice ball extended beyond lesion: Yes   Outcome: patient tolerated procedure well with no complications   Post-procedure details: wound care  instructions given   Return in about 1 year (around 08/09/2024) for TBSE.  IRandee Busing, CMA, am acting as scribe for Celine Collard, MD.   Documentation: I have reviewed the above documentation for accuracy and completeness, and I agree with the above.  Celine Collard, MD

## 2023-08-11 ENCOUNTER — Encounter: Payer: Self-pay | Admitting: Dermatology

## 2023-11-23 ENCOUNTER — Other Ambulatory Visit
Admission: RE | Admit: 2023-11-23 | Discharge: 2023-11-23 | Disposition: A | Discharge status: Home / Self Care | Source: Ambulatory Visit | Attending: Internal Medicine | Admitting: Internal Medicine

## 2023-11-23 DIAGNOSIS — R7309 Other abnormal glucose: Secondary | ICD-10-CM | POA: Diagnosis not present

## 2023-11-23 DIAGNOSIS — Z1331 Encounter for screening for depression: Secondary | ICD-10-CM | POA: Diagnosis not present

## 2023-11-23 DIAGNOSIS — R091 Pleurisy: Secondary | ICD-10-CM | POA: Insufficient documentation

## 2023-11-23 DIAGNOSIS — R8290 Unspecified abnormal findings in urine: Secondary | ICD-10-CM | POA: Diagnosis not present

## 2023-11-23 DIAGNOSIS — E785 Hyperlipidemia, unspecified: Secondary | ICD-10-CM | POA: Diagnosis not present

## 2023-11-23 DIAGNOSIS — E079 Disorder of thyroid, unspecified: Secondary | ICD-10-CM | POA: Diagnosis not present

## 2023-11-23 DIAGNOSIS — Z0000 Encounter for general adult medical examination without abnormal findings: Secondary | ICD-10-CM | POA: Diagnosis not present

## 2023-11-23 DIAGNOSIS — R0781 Pleurodynia: Secondary | ICD-10-CM | POA: Diagnosis not present

## 2023-11-23 DIAGNOSIS — E559 Vitamin D deficiency, unspecified: Secondary | ICD-10-CM | POA: Diagnosis not present

## 2023-11-23 DIAGNOSIS — Z79899 Other long term (current) drug therapy: Secondary | ICD-10-CM | POA: Diagnosis not present

## 2023-11-23 LAB — D-DIMER, QUANTITATIVE: D-Dimer, Quant: 0.62 ug{FEU}/mL — ABNORMAL HIGH (ref 0.00–0.50)

## 2023-11-24 ENCOUNTER — Other Ambulatory Visit: Payer: Self-pay | Admitting: Internal Medicine

## 2023-11-24 DIAGNOSIS — R091 Pleurisy: Secondary | ICD-10-CM

## 2023-11-24 DIAGNOSIS — R7989 Other specified abnormal findings of blood chemistry: Secondary | ICD-10-CM

## 2023-11-30 ENCOUNTER — Ambulatory Visit: Payer: Medicare HMO | Admitting: Dermatology

## 2023-12-01 ENCOUNTER — Encounter: Payer: Self-pay | Admitting: Dermatology

## 2023-12-01 ENCOUNTER — Ambulatory Visit: Admitting: Dermatology

## 2023-12-01 DIAGNOSIS — L03221 Cellulitis of neck: Secondary | ICD-10-CM | POA: Diagnosis not present

## 2023-12-01 DIAGNOSIS — L720 Epidermal cyst: Secondary | ICD-10-CM

## 2023-12-01 DIAGNOSIS — Z7189 Other specified counseling: Secondary | ICD-10-CM

## 2023-12-01 DIAGNOSIS — Z79899 Other long term (current) drug therapy: Secondary | ICD-10-CM

## 2023-12-01 DIAGNOSIS — L729 Follicular cyst of the skin and subcutaneous tissue, unspecified: Secondary | ICD-10-CM

## 2023-12-01 MED ORDER — DOXYCYCLINE MONOHYDRATE 100 MG PO CAPS
ORAL_CAPSULE | ORAL | 1 refills | Status: DC
Start: 2023-12-01 — End: 2024-01-02

## 2023-12-01 NOTE — Patient Instructions (Signed)

## 2023-12-01 NOTE — Progress Notes (Signed)
   Follow-Up Visit   Subjective  Madeline Green is a 73 y.o. female who presents for the following: Patient c/o cyst on the left chest for several years now, recently became inflamed, patient started Macrobid tablets for a urinary infection which seems to be helping cal.   Husband in the office with patient  The following portions of the chart were reviewed this encounter and updated as appropriate: medications, allergies, medical history  Review of Systems:  No other skin or systemic complaints except as noted in HPI or Assessment and Plan.  Objective  Well appearing patient in no apparent distress; mood and affect are within normal limits.  A focused examination was performed of the following areas: Left clavicle   Relevant exam findings are noted in the Assessment and Plan.       Assessment & Plan  Inflamed cyst with Cellulitis but WITHOUT Abscess   Firm pink nodule 2.5 cm on the left neck, supra clavicular - see photo  No fluctuance  Benign-appearing. Exam most consistent with an epidermal inclusion cyst. Discussed that a cyst is a benign growth that can grow over time and sometimes get irritated or inflamed. Recommend observation if it is not bothersome. Discussed option of surgical excision to remove it if it is growing, symptomatic, or other changes noted. Please call for new or changing lesions so they can be evaluated.  Start Doxycycline  Monohydrate 100 mg take 1 tablet twice a day with food for 14 days   Doxycycline  should be taken with food to prevent nausea. Do not lay down for 30 minutes after taking. Be cautious with sun exposure and use good sun protection while on this medication. Pregnant women should not take this medication.    May consider I&D in the future if any evidence of abscess formation.   Or surgery excisional removal in the future once inflammation/cellulitis calms down  recheck in 4 weeks       Return in about 4 weeks (around 12/29/2023) for  cyst .  I, Fay Kirks, CMA, am acting as scribe for Alm Rhyme, MD .   Documentation: I have reviewed the above documentation for accuracy and completeness, and I agree with the above.  Alm Rhyme, MD

## 2023-12-06 ENCOUNTER — Ambulatory Visit
Admission: RE | Admit: 2023-12-06 | Discharge: 2023-12-06 | Disposition: A | Discharge status: Home / Self Care | Source: Ambulatory Visit | Attending: Internal Medicine | Admitting: Internal Medicine

## 2023-12-06 ENCOUNTER — Encounter: Payer: Self-pay | Admitting: Dermatology

## 2023-12-06 DIAGNOSIS — R7989 Other specified abnormal findings of blood chemistry: Secondary | ICD-10-CM | POA: Diagnosis not present

## 2023-12-06 DIAGNOSIS — I700 Atherosclerosis of aorta: Secondary | ICD-10-CM | POA: Diagnosis not present

## 2023-12-06 DIAGNOSIS — R091 Pleurisy: Secondary | ICD-10-CM | POA: Insufficient documentation

## 2023-12-06 DIAGNOSIS — I517 Cardiomegaly: Secondary | ICD-10-CM | POA: Diagnosis not present

## 2023-12-06 MED ORDER — IOHEXOL 350 MG/ML SOLN
75.0000 mL | Freq: Once | INTRAVENOUS | Status: AC | PRN
  Administered 2023-12-06: 75 mL via INTRAVENOUS

## 2023-12-13 ENCOUNTER — Ambulatory Visit: Admitting: Dermatology

## 2023-12-31 ENCOUNTER — Inpatient Hospital Stay
Admission: EM | Admit: 2023-12-31 | Discharge: 2024-01-02 | DRG: 389 | Disposition: A | Discharge status: Home / Self Care | Attending: Internal Medicine | Admitting: Internal Medicine

## 2023-12-31 ENCOUNTER — Other Ambulatory Visit: Payer: Self-pay

## 2023-12-31 ENCOUNTER — Emergency Department

## 2023-12-31 ENCOUNTER — Inpatient Hospital Stay

## 2023-12-31 DIAGNOSIS — E039 Hypothyroidism, unspecified: Secondary | ICD-10-CM | POA: Diagnosis present

## 2023-12-31 DIAGNOSIS — Z811 Family history of alcohol abuse and dependence: Secondary | ICD-10-CM

## 2023-12-31 DIAGNOSIS — Z1152 Encounter for screening for COVID-19: Secondary | ICD-10-CM | POA: Diagnosis not present

## 2023-12-31 DIAGNOSIS — N83209 Unspecified ovarian cyst, unspecified side: Secondary | ICD-10-CM | POA: Diagnosis present

## 2023-12-31 DIAGNOSIS — Z6826 Body mass index (BMI) 26.0-26.9, adult: Secondary | ICD-10-CM

## 2023-12-31 DIAGNOSIS — Z86718 Personal history of other venous thrombosis and embolism: Secondary | ICD-10-CM

## 2023-12-31 DIAGNOSIS — K5750 Diverticulosis of both small and large intestine without perforation or abscess without bleeding: Secondary | ICD-10-CM | POA: Diagnosis not present

## 2023-12-31 DIAGNOSIS — K5730 Diverticulosis of large intestine without perforation or abscess without bleeding: Secondary | ICD-10-CM | POA: Diagnosis present

## 2023-12-31 DIAGNOSIS — Z823 Family history of stroke: Secondary | ICD-10-CM | POA: Diagnosis not present

## 2023-12-31 DIAGNOSIS — E785 Hyperlipidemia, unspecified: Secondary | ICD-10-CM | POA: Diagnosis not present

## 2023-12-31 DIAGNOSIS — I10 Essential (primary) hypertension: Secondary | ICD-10-CM | POA: Diagnosis not present

## 2023-12-31 DIAGNOSIS — I5032 Chronic diastolic (congestive) heart failure: Secondary | ICD-10-CM | POA: Diagnosis not present

## 2023-12-31 DIAGNOSIS — Z888 Allergy status to other drugs, medicaments and biological substances status: Secondary | ICD-10-CM | POA: Diagnosis not present

## 2023-12-31 DIAGNOSIS — Z79890 Hormone replacement therapy (postmenopausal): Secondary | ICD-10-CM

## 2023-12-31 DIAGNOSIS — N83201 Unspecified ovarian cyst, right side: Secondary | ICD-10-CM | POA: Diagnosis present

## 2023-12-31 DIAGNOSIS — N179 Acute kidney failure, unspecified: Secondary | ICD-10-CM | POA: Diagnosis present

## 2023-12-31 DIAGNOSIS — R001 Bradycardia, unspecified: Secondary | ICD-10-CM | POA: Diagnosis present

## 2023-12-31 DIAGNOSIS — Z833 Family history of diabetes mellitus: Secondary | ICD-10-CM

## 2023-12-31 DIAGNOSIS — Z79899 Other long term (current) drug therapy: Secondary | ICD-10-CM | POA: Diagnosis not present

## 2023-12-31 DIAGNOSIS — E860 Dehydration: Secondary | ICD-10-CM | POA: Diagnosis present

## 2023-12-31 DIAGNOSIS — N3090 Cystitis, unspecified without hematuria: Secondary | ICD-10-CM | POA: Diagnosis not present

## 2023-12-31 DIAGNOSIS — Z8249 Family history of ischemic heart disease and other diseases of the circulatory system: Secondary | ICD-10-CM

## 2023-12-31 DIAGNOSIS — I700 Atherosclerosis of aorta: Secondary | ICD-10-CM | POA: Diagnosis present

## 2023-12-31 DIAGNOSIS — N390 Urinary tract infection, site not specified: Secondary | ICD-10-CM | POA: Diagnosis present

## 2023-12-31 DIAGNOSIS — E663 Overweight: Secondary | ICD-10-CM | POA: Diagnosis not present

## 2023-12-31 DIAGNOSIS — Z832 Family history of diseases of the blood and blood-forming organs and certain disorders involving the immune mechanism: Secondary | ICD-10-CM

## 2023-12-31 DIAGNOSIS — K56609 Unspecified intestinal obstruction, unspecified as to partial versus complete obstruction: Secondary | ICD-10-CM | POA: Diagnosis not present

## 2023-12-31 DIAGNOSIS — N1831 Chronic kidney disease, stage 3a: Secondary | ICD-10-CM | POA: Diagnosis present

## 2023-12-31 DIAGNOSIS — Z9049 Acquired absence of other specified parts of digestive tract: Secondary | ICD-10-CM | POA: Diagnosis not present

## 2023-12-31 DIAGNOSIS — Z4682 Encounter for fitting and adjustment of non-vascular catheter: Secondary | ICD-10-CM | POA: Diagnosis not present

## 2023-12-31 DIAGNOSIS — R918 Other nonspecific abnormal finding of lung field: Secondary | ICD-10-CM | POA: Diagnosis not present

## 2023-12-31 HISTORY — DX: Bradycardia, unspecified: R00.1

## 2023-12-31 HISTORY — DX: Hyperlipidemia, unspecified: E78.5

## 2023-12-31 HISTORY — DX: Chronic kidney disease, stage 3a: N18.31

## 2023-12-31 LAB — BASIC METABOLIC PANEL WITH GFR
Anion gap: 16 — ABNORMAL HIGH (ref 5–15)
BUN: 25 mg/dL — ABNORMAL HIGH (ref 8–23)
CO2: 21 mmol/L — ABNORMAL LOW (ref 22–32)
Calcium: 9.2 mg/dL (ref 8.9–10.3)
Chloride: 96 mmol/L — ABNORMAL LOW (ref 98–111)
Creatinine, Ser: 1.47 mg/dL — ABNORMAL HIGH (ref 0.44–1.00)
GFR, Estimated: 37 mL/min — ABNORMAL LOW (ref >60)
Glucose, Bld: 127 mg/dL — ABNORMAL HIGH (ref 70–99)
Potassium: 4.2 mmol/L (ref 3.5–5.1)
Sodium: 133 mmol/L — ABNORMAL LOW (ref 135–145)

## 2023-12-31 LAB — CBC
HCT: 46.6 % — ABNORMAL HIGH (ref 36.0–46.0)
Hemoglobin: 16.2 g/dL — ABNORMAL HIGH (ref 12.0–15.0)
MCH: 31.8 pg (ref 26.0–34.0)
MCHC: 34.8 g/dL (ref 30.0–36.0)
MCV: 91.4 fL (ref 80.0–100.0)
Platelets: 260 K/uL (ref 150–400)
RBC: 5.1 MIL/uL (ref 3.87–5.11)
RDW: 14 % (ref 11.5–15.5)
WBC: 10.3 K/uL (ref 4.0–10.5)
nRBC: 0 % (ref 0.0–0.2)

## 2023-12-31 LAB — RESP PANEL BY RT-PCR (RSV, FLU A&B, COVID)  RVPGX2
Influenza A by PCR: NEGATIVE
Influenza B by PCR: NEGATIVE
Resp Syncytial Virus by PCR: NEGATIVE
SARS Coronavirus 2 by RT PCR: NEGATIVE

## 2023-12-31 LAB — APTT: aPTT: 26 s (ref 24–36)

## 2023-12-31 LAB — TYPE AND SCREEN
ABO/RH(D): A POS
Antibody Screen: NEGATIVE

## 2023-12-31 LAB — PROTIME-INR
INR: 1 (ref 0.8–1.2)
Prothrombin Time: 13.3 s (ref 11.4–15.2)

## 2023-12-31 LAB — BRAIN NATRIURETIC PEPTIDE: B Natriuretic Peptide: 54.1 pg/mL (ref 0.0–100.0)

## 2023-12-31 LAB — URINALYSIS, COMPLETE (UACMP) WITH MICROSCOPIC
Bacteria, UA: NONE SEEN
Bilirubin Urine: NEGATIVE
Glucose, UA: NEGATIVE mg/dL
Hgb urine dipstick: NEGATIVE
Ketones, ur: 20 mg/dL — AB
Nitrite: NEGATIVE
Protein, ur: NEGATIVE mg/dL
Specific Gravity, Urine: >1.046 — ABNORMAL HIGH (ref 1.005–1.030)
pH: 5 (ref 5.0–8.0)

## 2023-12-31 LAB — HEPATIC FUNCTION PANEL
ALT: 14 U/L (ref 0–44)
AST: 21 U/L (ref 15–41)
Albumin: 3.2 g/dL — ABNORMAL LOW (ref 3.5–5.0)
Alkaline Phosphatase: 53 U/L (ref 38–126)
Bilirubin, Direct: 0.2 mg/dL (ref 0.0–0.2)
Indirect Bilirubin: 1.2 mg/dL — ABNORMAL HIGH (ref 0.3–0.9)
Total Bilirubin: 1.4 mg/dL — ABNORMAL HIGH (ref 0.0–1.2)
Total Protein: 6.9 g/dL (ref 6.5–8.1)

## 2023-12-31 LAB — TSH: TSH: 8.924 u[IU]/mL — ABNORMAL HIGH (ref 0.350–4.500)

## 2023-12-31 LAB — LIPASE, BLOOD: Lipase: 38 U/L (ref 11–51)

## 2023-12-31 LAB — TROPONIN I (HIGH SENSITIVITY): Troponin I (High Sensitivity): 9 ng/L (ref <18)

## 2023-12-31 MED ORDER — ROSUVASTATIN CALCIUM 10 MG PO TABS
10.0000 mg | ORAL_TABLET | Freq: Every day | ORAL | Status: DC
Start: 2024-01-01 — End: 2024-01-02
  Administered 2024-01-02: 10 mg via ORAL
  Filled 2023-12-31: qty 1

## 2023-12-31 MED ORDER — LEVOTHYROXINE SODIUM 100 MCG/5ML IV SOLN: 37.5000 ug | Freq: Every day | INTRAVENOUS | Status: DC

## 2023-12-31 MED ORDER — KETOROLAC TROMETHAMINE 15 MG/ML IJ SOLN
15.0000 mg | Freq: Once | INTRAMUSCULAR | Status: AC
  Administered 2023-12-31: 15 mg via INTRAVENOUS
  Filled 2023-12-31: qty 1

## 2023-12-31 MED ORDER — ZOLPIDEM TARTRATE 5 MG PO TABS
5.0000 mg | ORAL_TABLET | Freq: Every evening | ORAL | Status: DC | PRN
  Administered 2023-12-31 – 2024-01-01 (×2): 5 mg via ORAL
  Filled 2023-12-31 (×2): qty 1

## 2023-12-31 MED ORDER — SODIUM CHLORIDE 0.9 % IV BOLUS
1000.0000 mL | Freq: Once | INTRAVENOUS | Status: AC
  Administered 2023-12-31: 1000 mL via INTRAVENOUS

## 2023-12-31 MED ORDER — LEVOTHYROXINE SODIUM 50 MCG PO TABS
75.0000 ug | ORAL_TABLET | Freq: Every day | ORAL | Status: DC
Start: 2024-01-01 — End: 2024-01-02
  Administered 2024-01-01: 75 ug via ORAL
  Filled 2023-12-31: qty 2

## 2023-12-31 MED ORDER — IOHEXOL 300 MG/ML  SOLN
100.0000 mL | Freq: Once | INTRAMUSCULAR | Status: AC | PRN
  Administered 2023-12-31: 75 mL via INTRAVENOUS

## 2023-12-31 MED ORDER — ONDANSETRON HCL 4 MG/2ML IJ SOLN
4.0000 mg | Freq: Once | INTRAMUSCULAR | Status: AC
  Administered 2023-12-31: 4 mg via INTRAVENOUS
  Filled 2023-12-31: qty 2

## 2023-12-31 MED ORDER — MORPHINE SULFATE (PF) 2 MG/ML IV SOLN: 2.0000 mg | INTRAVENOUS | Status: DC | PRN

## 2023-12-31 MED ORDER — SODIUM CHLORIDE 0.9 % IV SOLN: INTRAVENOUS | Status: DC

## 2023-12-31 MED ORDER — OXYCODONE-ACETAMINOPHEN 5-325 MG PO TABS: 1.0000 | ORAL_TABLET | ORAL | Status: DC | PRN

## 2023-12-31 MED ORDER — ONDANSETRON HCL 4 MG/2ML IJ SOLN: 4.0000 mg | Freq: Three times a day (TID) | INTRAMUSCULAR | Status: DC | PRN

## 2023-12-31 MED ORDER — ACETAMINOPHEN 325 MG PO TABS: 650.0000 mg | ORAL_TABLET | Freq: Four times a day (QID) | ORAL | Status: DC | PRN

## 2023-12-31 MED ORDER — HEPARIN SODIUM (PORCINE) 5000 UNIT/ML IJ SOLN
5000.0000 [IU] | Freq: Three times a day (TID) | INTRAMUSCULAR | Status: DC
  Administered 2023-12-31 – 2024-01-02 (×5): 5000 [IU] via SUBCUTANEOUS
  Filled 2023-12-31 (×5): qty 1

## 2023-12-31 NOTE — ED Provider Notes (Signed)
 [facility] Provider Note    Event Date/Time   First MD Initiated Contact with Patient 12/31/23 1645     (approximate)   History   Abdominal Pain  Pt to ed from home via POV for sore stomach, weak, dehydrated, heart rate all over the place and my oxygen is low and green bile since Wednesday. Pt is caox4, I no acute distress in triage.    HPI Madeline Green is a 73 y.o. female PMH hyperlipidemia presents for evaluation of abdominal pain, vomiting - Patient developed diffuse abdominal pain that appears to be worse in the lower abdomen on Thursday.  Has had intractable vomiting since then, described as bilious.  Nonbloody.  No bowel movement in this timeframe as well.  Not tolerating any p.o.  Last p.o. intake was 7 AM and she attempted to eat toast, quickly vomited this. 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  Says her heart rate did briefly dip to the 30s as well her home monitor.       Physical Exam   Triage Vital Signs: ED Triage Vitals  Encounter Vitals Group     BP 12/31/23 1548 107/74     Girls Systolic BP Percentile --      Girls Diastolic BP Percentile --      Boys Systolic BP Percentile --      Boys Diastolic BP Percentile --      Pulse Rate 12/31/23 1548 81     Resp 12/31/23 1548 17     Temp 12/31/23 1548 98.6 F (37 C)     Temp Source 12/31/23 1548 Oral     SpO2 12/31/23 1548 98 %     Weight 12/31/23 1548 155 lb (70.3 kg)     Height 12/31/23 1548 5' 4 (1.626 m)     Head Circumference --      Peak Flow --      Pain Score 12/31/23 1554 5     Pain Loc --      Pain Education --      Exclude from Growth Chart --     Most recent vital signs: Vitals:   12/31/23 1935 12/31/23 1940  BP:     Pulse: 62   Resp:    Temp:    SpO2:  100%     General: Awake, no distress.  CV:  Good peripheral perfusion. RRR, RP 2+ Resp:  Normal effort. CTAB Abd:  Mildly distention.  Tender to palpation throughout but most grimacing throughout lower abdomen.,  Voluntary guarding.  No CVAT.   ED Results / Procedures / Treatments   Labs (all labs ordered are listed, but only abnormal results are displayed) Labs Reviewed  BASIC METABOLIC PANEL WITH GFR - Abnormal; Notable for the following components:      Result Value   Sodium 133 (*)    Chloride 96 (*)    CO2 21 (*)    Glucose, Bld 127 (*)    BUN 25 (*)    Creatinine, Ser 1.47 (*)    GFR, Estimated 37 (*)    Anion gap 16 (*)    All other components within normal limits  CBC - Abnormal; Notable for the following components:   Hemoglobin 16.2 (*)  HCT 46.6 (*)    All other components within normal limits  HEPATIC FUNCTION PANEL - Abnormal; Notable for the following components:   Albumin 3.2 (*)    Total Bilirubin 1.4 (*)    Indirect Bilirubin 1.2 (*)    All other components within normal limits  RESP PANEL BY RT-PCR (RSV, FLU A&B, COVID)  RVPGX2  LIPASE, BLOOD  TROPONIN I (HIGH SENSITIVITY)     EKG  See ED course below   RADIOLOGY Radiology interpreted by myself and radiology report reviewed.  SBO noted, transition point in right lower quadrant.  No other acute pathology identified.    PROCEDURES:  Critical Care performed: No  Procedures   MEDICATIONS ORDERED IN ED: Medications  sodium chloride  0.9 % bolus 1,000 mL (1,000 mLs Intravenous New Bag/Given 12/31/23 1827)  ondansetron  (ZOFRAN ) injection 4 mg (4 mg Intravenous Given 12/31/23 1828)  ketorolac (TORADOL) 15 MG/ML injection 15 mg (15 mg Intravenous Given 12/31/23 1830)  iohexol  (OMNIPAQUE ) 300 MG/ML solution 100 mL (75 mLs Intravenous Contrast Given 12/31/23 1846)     IMPRESSION / MDM / ASSESSMENT AND PLAN / ED COURSE  I reviewed the triage vital signs  and the nursing notes.                              DDX/MDM/AP: Differential diagnosis includes, but is not limited to, bowel obstruction, appendicitis, diverticulitis, consider UTI/pyelonephritis, perforated viscus, cholecystitis, pancreatitis.  Suspect some degree of dehydration.  Plan: - Labs - Patient declines narcotics for pain control, will treat with small dose Toradol - Antiemetics - IV fluid  Patient's presentation is most consistent with acute presentation with potential threat to life or bodily function.  The patient is on the cardiac monitor to evaluate for evidence of arrhythmia and/or significant heart rate changes.  ED course below.  Workup notable for AKI, SBO.  Given IV fluid, general surgery consulted, recommend NG tube and admit to hospitalist.  General surgery to follow.  Clinical Course as of 12/31/23 2007  Sat Dec 31, 2023  1720 CBC with no leukocytosis, elevated hemoglobin suggestive of hemoconcentration [MM]  1720 BMP with hyponatremia, AKI, mildly elevated anion gap that I suspect is secondary to mild uremia  Hepatobiliary labs reviewed, overall unremarkable, very mild elevation in bilirubin  Troponin normal [MM]  1721 Chest x-ray interpreted by myself and radiology report reviewed.  No acute pathology identified.  IMPRESSION: No active cardiopulmonary disease.   [MM]  1721 Ecg = sinus rhythm, rate 95, no gross ST elevation or depression, no significant repolarization abnormality normal intervals.  No clear evidence of ischemia nor arrhythmia on my interpretation. [MM]  1929 CT reviewed by myself, concerning for SBO, radiology report pending [MM]  1956 D/w Dr. Jordis of general surgery - recommend NGT - admit to hospitalist - Gen surg to follow  Hospitalist consult order placed [MM]    Clinical Course User Index [MM] Clarine Ozell LABOR, MD     FINAL CLINICAL IMPRESSION(S) / ED DIAGNOSES   Final diagnoses:  Small bowel obstruction (HCC)     Rx  / DC Orders   ED Discharge Orders     None        Note:  This document was prepared using Dragon voice recognition software and may include unintentional dictation errors.   Clarine Ozell LABOR, MD 12/31/23 2007

## 2023-12-31 NOTE — ED Notes (Signed)
 Pt NG tube advanced 10cm to 60mm. Pt tolerated well. Pt hooked to suction and green bile noted to be draining. Pt placed on monitor with CCMD.

## 2023-12-31 NOTE — ED Notes (Signed)
 Patient has yellow fall bracelet on. Posey alarm is not at bedside, will attempt to find an extra one in the department. Patient has own shoes on per preference.

## 2023-12-31 NOTE — ED Triage Notes (Signed)
 Pt to ed from home via POV for sore stomach, weak, dehydrated, heart rate all over the place and my oxygen is low and green bile since Wednesday. Pt is caox4, I no acute distress in triage.

## 2023-12-31 NOTE — H&P (Signed)
 History and Physical    [name] FMW:[id number] DOB: 1950/12/09 DOA: 12/31/2023  Referring MD/NP/PA:   PCP: Auston Reyes BIRCH, MD   Patient coming from:  The patient is coming from home.     Chief Complaint: Abdominal pain  HPI: Madeline Green is a 73 y.o. female with medical history significant of partial colectomy due to diverticulitis with abdominal abscess,  HLD, hypothyroidism, dCHF, CKD-3A, remote DVT not on anticoagulants, whitecoat syndrome, vitamin B12 deficiency, Colo-vesicle fistula, chronic bradycardia, UTI, who presents with abdominal pain.  Patient states she has nausea, vomiting and abdominal pain in the past 3 days.  She has multiple episodes of bilious and nonbloody vomiting each day.  Her abdominal is distended with diffuse tenderness.  Her abdominal pain is constant, cramping, moderate, nonradiating, not aggravated or alleviated by any known factors.  Last bowel movement was 3 days ago.  Patient had 1 episode of oxygen desaturated to 85% on room air which improved to 100% on room air without treatment when I saw patient in ED. Patient denies SOB, chest pain, cough.  No fever or chills. Patient has mild burning with urination and feeling pressure on urination.  No dysuria.  She took 1 pill of leftover ciprofloxacin  today.  Per report, patient with 1 episode of bradycardia with heart rate down to 30s.  Current of heart rate 60-80s.  Patient states that she has chronic bradycardia.  Data reviewed independently and ED Course: pt was found to have WBC 10.3, negative PCR for COVID, flu and RSV,  BNP 54.1, worsening renal function.  Temperature normal, blood pressure 121/69, RR 17.  Chest x-ray negative.  CT scan showed small bowel obstruction.  Patient is admitted to telemetry bed as inpatient.  Dr. Jordis of surgery is consulted.  CT abdomen/pelvis: 1. Small bowel obstruction with transition point in the right lower quadrant. Mesenteric edema and free fluid. 2. Colonic  diverticulosis without focal diverticulitis. 3. Mild bladder wall thickening, can be seen with cystitis. 4. Right ovarian/adnexal cyst measuring 4 cm. Follow-up pelvic ultrasound is recommended in 6-12 months.   Aortic Atherosclerosis (ICD10-I70.0).    EKG: I have personally reviewed.  Sinus rhythm, QTc 434, heart rate 95, poor R wave progression.   Review of Systems:   General: no fevers, chills, no body weight gain, has poor appetite, has fatigue HEENT: no blurry vision, hearing changes or sore throat Respiratory: no dyspnea, coughing, wheezing CV: no chest pain, no palpitations GI: has nausea, vomiting, abdominal pain and distension, no diarrhea, constipation GU: no dysuria, has burning on urination, no increased urinary frequency, hematuria  Ext: no leg edema Neuro: no unilateral weakness, numbness, or tingling, no vision change or hearing loss Skin: no rash, no skin tear. MSK: No muscle spasm, no deformity, no limitation of range of movement in spin Heme: No easy bruising.  Travel history: No recent long distant travel.   Allergy:  Allergies  Allergen Reactions   Betadine [Povidone Iodine] Hives    Past Medical History:  Diagnosis Date   Acquired hypothyroidism    Allergy    Arthritis    Bradycardia    Chronic kidney disease, stage 3a (HCC)    History of blood clots    HLD (hyperlipidemia)    White coat syndrome without diagnosis of hypertension 02/08/2019    Past Surgical History:  Procedure Laterality Date   CATARACT EXTRACTION W/PHACO Left 12/02/2021   Procedure: CATARACT EXTRACTION PHACO AND INTRAOCULAR LENS PLACEMENT (IOC) LEFT 7.46 00:53.5;  Surgeon: Mittie,  Dene, MD;  Location: [facility];  Service: Ophthalmology;  Laterality: Left;   CATARACT EXTRACTION W/PHACO Right 01/13/2022   Procedure: CATARACT EXTRACTION PHACO AND INTRAOCULAR LENS PLACEMENT (IOC) RIGHT 11.01 01:17.7;  Surgeon: Mittie Dene, MD;  Location: [facility];  Service: Ophthalmology;  Laterality: Right;   CHOLECYSTECTOMY     COLONOSCOPY WITH PROPOFOL  N/A 04/22/2020   Procedure: COLONOSCOPY WITH PROPOFOL ;  Surgeon: Maryruth Ole DASEN, MD;  Location: [facility];  Service: Endoscopy;  Laterality: N/A;   Partial colectomy due to diverticulitis with abdominal abscess     VARICOSE VEIN SURGERY Left     Social History:  reports that she has never smoked. She has never used smokeless tobacco. She reports current alcohol use of about 10.0 standard drinks of alcohol per week. She reports that she does not use drugs.  Family History:  Family History  Problem Relation Age of Onset   Alcohol abuse Mother    Hypertension Mother    Stroke Mother    Varicose Veins Mother    Alcohol abuse Father    Heart disease Father    Diabetes Father      Prior to Admission medications   Medication Sig Start Date End Date Taking? Authorizing Provider  doxycycline  (MONODOX ) 100 MG capsule Take 1 tablet twice a day for 14 days with food and drink 12/01/23   Hester Alm BROCKS, MD  fluorouracil  (EFUDEX ) 5 % cream Apply thin coat topically to chest twice daily for 2 weeks 08/10/23   Hester Alm BROCKS, MD  levothyroxine (SYNTHROID) 75 MCG tablet Take 75 mcg by mouth daily before breakfast.    [provider]    Physical Exam: Vitals:   12/31/23 1934 12/31/23 1935 12/31/23 1940 12/31/23 2126  BP: 121/69   (!) 157/87  Pulse:  62  63  Resp:    20  Temp:    97.8 F (36.6 C)  TempSrc:    Oral  SpO2:   100% 99%  Weight:      Height:       General: Not in acute distress HEENT:       Eyes: PERRL, EOMI, no jaundice       ENT: No discharge from the ears and nose, no pharynx injection, no tonsillar enlargement.        Neck: No JVD, no bruit, no mass felt. Heme: No neck lymph node enlargement. Cardiac: S1/S2, RRR, No murmurs, No gallops or rubs. Respiratory: No rales, wheezing, rhonchi or rubs. GI: has abdominal distention and diffuse abdominal  tenderness.  No rebound pain, no organomegaly, BS present. GU: No hematuria Ext: No pitting leg edema bilaterally. 1+DP/PT pulse bilaterally. Musculoskeletal: No joint deformities, No joint redness or warmth, no limitation of ROM in spin. Skin: No rashes.  Neuro: Alert, oriented X3, cranial nerves II-XII grossly intact, moves all extremities normally. Psych: Patient is not psychotic, no suicidal or hemocidal ideation.  Labs on Admission: I have personally reviewed following labs and imaging studies  CBC: Recent Labs  Lab 12/31/23 1553  WBC 10.3  HGB 16.2*  HCT 46.6*  MCV 91.4  PLT 260   Basic Metabolic Panel: Recent Labs  Lab 12/31/23 1553  NA 133*  K 4.2  CL 96*  CO2 21*  GLUCOSE 127*  BUN 25*  CREATININE 1.47*  CALCIUM 9.2   GFR: Estimated Creatinine Clearance: 32.8 mL/min (A) (by C-G formula based on SCr of 1.47 mg/dL (H)). Liver Function Tests: Recent Labs  Lab 12/31/23 1553  AST  21  ALT 14  ALKPHOS 53  BILITOT 1.4*  PROT 6.9  ALBUMIN 3.2*   Recent Labs  Lab 12/31/23 1553  LIPASE 38   No results for input(s): AMMONIA in the last 168 hours. Coagulation Profile: Recent Labs  Lab 12/31/23 2207  INR 1.0   Cardiac Enzymes: No results for input(s): CKTOTAL, CKMB, CKMBINDEX, TROPONINI in the last 168 hours. BNP (last 3 results) No results for input(s): PROBNP in the last 8760 hours. HbA1C: No results for input(s): HGBA1C in the last 72 hours. CBG: No results for input(s): GLUCAP in the last 168 hours. Lipid Profile: No results for input(s): CHOL, HDL, LDLCALC, TRIG, CHOLHDL, LDLDIRECT in the last 72 hours. Thyroid  Function Tests: Recent Labs    12/31/23 1553  TSH 8.924*   Anemia Panel: No results for input(s): VITAMINB12, FOLATE, FERRITIN, TIBC, IRON, RETICCTPCT in the last 72 hours. Urine analysis:    Component Value Date/Time   COLORURINE YELLOW (A) 12/31/2023 1553   APPEARANCEUR CLEAR (A)  12/31/2023 1553   APPEARANCEUR CLEAR 03/03/2012 0947   LABSPEC >1.046 (H) 12/31/2023 1553   LABSPEC 1.015 03/03/2012 0947   PHURINE 5.0 12/31/2023 1553   GLUCOSEU NEGATIVE 12/31/2023 1553   GLUCOSEU NEGATIVE 03/03/2012 0947   HGBUR NEGATIVE 12/31/2023 1553   BILIRUBINUR NEGATIVE 12/31/2023 1553   BILIRUBINUR negative 02/09/2016 1618   BILIRUBINUR NEGATIVE 03/03/2012 0947   KETONESUR 20 (A) 12/31/2023 1553   PROTEINUR NEGATIVE 12/31/2023 1553   UROBILINOGEN 0.2 02/09/2016 1618   NITRITE NEGATIVE 12/31/2023 1553   LEUKOCYTESUR TRACE (A) 12/31/2023 1553   LEUKOCYTESUR TRACE 03/03/2012 0947   Sepsis Labs: @LABRCNTIP (procalcitonin:4,lacticidven:4) ) Recent Results (from the past 240 hours)  Resp panel by RT-PCR (RSV, Flu A&B, Covid) Anterior Nasal Swab     Status: None   Collection Time: 12/31/23  6:17 PM   Specimen: Anterior Nasal Swab  Result Value Ref Range Status   SARS Coronavirus 2 by RT PCR NEGATIVE NEGATIVE Final    Comment: (NOTE) SARS-CoV-2 target nucleic acids are NOT DETECTED.  The SARS-CoV-2 RNA is generally detectable in upper respiratory specimens during the acute phase of infection. The lowest concentration of SARS-CoV-2 viral copies this assay can detect is 138 copies/mL. A negative result does not preclude SARS-Cov-2 infection and should not be used as the sole basis for treatment or other patient management decisions. A negative result may occur with  improper specimen collection/handling, submission of specimen other than nasopharyngeal swab, presence of viral mutation(s) within the areas targeted by this assay, and inadequate number of viral copies(<138 copies/mL). A negative result must be combined with clinical observations, patient history, and epidemiological information. The expected result is Negative.  Fact Sheet for Patients:  BloggerCourse.com  Fact Sheet for Healthcare Providers:   SeriousBroker.it  This test is no t yet approved or cleared by the [place]  FDA and  has been authorized for detection and/or diagnosis of SARS-CoV-2 by FDA under an Emergency Use Authorization (EUA). This EUA will remain  in effect (meaning this test can be used) for the duration of the COVID-19 declaration under Section 564(b)(1) of the Act, 21 U.S.C.section 360bbb-3(b)(1), unless the authorization is terminated  or revoked sooner.       Influenza A by PCR NEGATIVE NEGATIVE Final   Influenza B by PCR NEGATIVE NEGATIVE Final    Comment: (NOTE) The Xpert Xpress SARS-CoV-2/FLU/RSV plus assay is intended as an aid in the diagnosis of influenza from Nasopharyngeal swab specimens and should not be used as a sole  basis for treatment. Nasal washings and aspirates are unacceptable for Xpert Xpress SARS-CoV-2/FLU/RSV testing.  Fact Sheet for Patients: BloggerCourse.com  Fact Sheet for Healthcare Providers: SeriousBroker.it  This test is not yet approved or cleared by the [place]  FDA and has been authorized for detection and/or diagnosis of SARS-CoV-2 by FDA under an Emergency Use Authorization (EUA). This EUA will remain in effect (meaning this test can be used) for the duration of the COVID-19 declaration under Section 564(b)(1) of the Act, 21 U.S.C. section 360bbb-3(b)(1), unless the authorization is terminated or revoked.     Resp Syncytial Virus by PCR NEGATIVE NEGATIVE Final    Comment: (NOTE) Fact Sheet for Patients: BloggerCourse.com  Fact Sheet for Healthcare Providers: SeriousBroker.it  This test is not yet approved or cleared by the [place]  FDA and has been authorized for detection and/or diagnosis of SARS-CoV-2 by FDA under an Emergency Use Authorization (EUA). This EUA will remain in effect (meaning this test can be used) for  the duration of the COVID-19 declaration under Section 564(b)(1) of the Act, 21 U.S.C. section 360bbb-3(b)(1), unless the authorization is terminated or revoked.  Performed at [facility], [address], [place], [place] [address]      Radiological Exams on Admission:   Assessment/Plan Principal Problem:   SBO (small bowel obstruction) (HCC) Active Problems:   Acquired hypothyroidism   Bradycardia   Acute renal failure superimposed on stage 3a chronic kidney disease (HCC)   Chronic diastolic CHF (congestive heart failure) (HCC)   HLD (hyperlipidemia)   Ovarian cyst   Overweight (BMI 25.0-29.9)   UTI (urinary tract infection)   Assessment and Plan:   SBO (small bowel obstruction) (HCC): Consulted Dr. Jordis of surgery  -Admit to tele bed as inpt -NPO   -NG tube -morphine, percocet and tylenol prn pain -Prn Zofran  prn nausea   -IVF: 1L of NS, the NS 75 cc/h -INR/PTT/type & screen -Follow-up general surgeon's recommendation  Acquired hypothyroidism -Synthroid  Bradycardia: Patient has sinus bradycardia.  This is a chronic issue.  Heart rate is in 60s-80s currently. -Check TSH - Telemonitoring  Acute renal failure superimposed on stage 3a chronic kidney disease (HCC): Recent baseline creatinine 0.97.  Her creatinine is 1.47, GFR 37, BUN 25.  Likely due to dehydration and UTI - IV fluids above - Avoid using renal toxic medications.   Chronic diastolic CHF (congestive heart failure) (HCC): 2D echo on 06/14/2023 showed EF> 55%.  Patient has not have leg edema or JVD.  CHF is compensated. -Check BNP  HLD (hyperlipidemia) -Crestor  Ovarian cyst: This is incidental findings. .CT showed right ovarian/adnexal cyst measuring 4 cm.  -pt has to follow-up pelvic ultrasound in 6-12 months.  Overweight (BMI 25.0-29.9): Body weight 70.3 kg, BMI 26.61 - Encourage losing weight - Exercise and healthy diet  Possible UTI: pt has burning on urination.  She took 1  pill of ciprofloxacin  by herself at home.  UA showed clear appearance, trace amount of leukocyte, negative bacteria, WBC 11-20, indicating possible UTI - Started Rocephin - Follow-up urine culture    DVT ppx: SQ Heparin     Code Status: Full code     Family Communication: Yes, patient's daughter has been    at bed side.    Disposition Plan:  Anticipate discharge back to previous environment  Consults called: Dr. Jordis of surgery  Admission status and Level of care: Telemetry Medical:  as inpt        Dispo: The patient is from: Home  Anticipated d/c is to: Home              Anticipated d/c date is: 2 days              Patient currently is not medically stable to d/c.    Severity of Illness:  The appropriate patient status for this patient is INPATIENT. Inpatient status is judged to be reasonable and necessary in order to provide the required intensity of service to ensure the patient's safety. The patient's presenting symptoms, physical exam findings, and initial radiographic and laboratory data in the context of their chronic comorbidities is felt to place them at high risk for further clinical deterioration. Furthermore, it is not anticipated that the patient will be medically stable for discharge from the hospital within 2 midnights of admission.   * I certify that at the point of admission it is my clinical judgment that the patient will require inpatient hospital care spanning beyond 2 midnights from the point of admission due to high intensity of service, high risk for further deterioration and high frequency of surveillance required.*       Date of Service 01/01/2024    Caleb Exon [facility]   If 7PM-7AM, please contact night-coverage [email or web address] 01/01/2024, 1:33 AM

## 2024-01-01 ENCOUNTER — Inpatient Hospital Stay

## 2024-01-01 DIAGNOSIS — K56609 Unspecified intestinal obstruction, unspecified as to partial versus complete obstruction: Secondary | ICD-10-CM

## 2024-01-01 DIAGNOSIS — N390 Urinary tract infection, site not specified: Secondary | ICD-10-CM | POA: Diagnosis present

## 2024-01-01 DIAGNOSIS — R001 Bradycardia, unspecified: Secondary | ICD-10-CM | POA: Diagnosis not present

## 2024-01-01 DIAGNOSIS — E785 Hyperlipidemia, unspecified: Secondary | ICD-10-CM

## 2024-01-01 DIAGNOSIS — I5032 Chronic diastolic (congestive) heart failure: Secondary | ICD-10-CM | POA: Diagnosis not present

## 2024-01-01 DIAGNOSIS — N179 Acute kidney failure, unspecified: Secondary | ICD-10-CM | POA: Diagnosis not present

## 2024-01-01 LAB — BASIC METABOLIC PANEL WITH GFR
Anion gap: 8 (ref 5–15)
BUN: 27 mg/dL — ABNORMAL HIGH (ref 8–23)
CO2: 28 mmol/L (ref 22–32)
Calcium: 8.1 mg/dL — ABNORMAL LOW (ref 8.9–10.3)
Chloride: 100 mmol/L (ref 98–111)
Creatinine, Ser: 1.3 mg/dL — ABNORMAL HIGH (ref 0.44–1.00)
GFR, Estimated: 43 mL/min — ABNORMAL LOW (ref >60)
Glucose, Bld: 76 mg/dL (ref 70–99)
Potassium: 3.8 mmol/L (ref 3.5–5.1)
Sodium: 136 mmol/L (ref 135–145)

## 2024-01-01 LAB — CBC
HCT: 42 % (ref 36.0–46.0)
Hemoglobin: 13.9 g/dL (ref 12.0–15.0)
MCH: 31.1 pg (ref 26.0–34.0)
MCHC: 33.1 g/dL (ref 30.0–36.0)
MCV: 94 fL (ref 80.0–100.0)
Platelets: 227 K/uL (ref 150–400)
RBC: 4.47 MIL/uL (ref 3.87–5.11)
RDW: 14.1 % (ref 11.5–15.5)
WBC: 6.5 K/uL (ref 4.0–10.5)
nRBC: 0 % (ref 0.0–0.2)

## 2024-01-01 MED ORDER — SODIUM CHLORIDE 0.9 % IV BOLUS
1000.0000 mL | Freq: Once | INTRAVENOUS | Status: AC
  Administered 2024-01-01: 1000 mL via INTRAVENOUS

## 2024-01-01 MED ORDER — BISACODYL 10 MG RE SUPP: 10.0000 mg | Freq: Every day | RECTAL | Status: DC | PRN

## 2024-01-01 MED ORDER — SODIUM CHLORIDE 0.9 % IV SOLN
1.0000 g | INTRAVENOUS | Status: DC
  Administered 2024-01-01 – 2024-01-02 (×2): 1 g via INTRAVENOUS
  Filled 2024-01-01 (×2): qty 10

## 2024-01-01 MED ORDER — DIATRIZOATE MEGLUMINE & SODIUM 66-10 % PO SOLN
90.0000 mL | Freq: Once | ORAL | Status: AC
  Administered 2024-01-01: 90 mL via NASOGASTRIC

## 2024-01-01 NOTE — Assessment & Plan Note (Signed)
 Imaging concerning for SBO with a transition point.  History of prior abdominal surgeries.  General surgery was consulted Gastrografin challenge was ordered today Might need laparotomy for concern of high-grade obstruction. - Continue with NG tube -Follow-up surgery recommendations -Continue with supportive care

## 2024-01-01 NOTE — Assessment & Plan Note (Signed)
 Incidental finding on CT scan.  Which showed right ovarian/adnexal cyst measuring 4 cm.  -pt has to follow-up pelvic ultrasound in 6-12 months.

## 2024-01-01 NOTE — Plan of Care (Signed)

## 2024-01-01 NOTE — Assessment & Plan Note (Signed)
 Mildly overweight with BMI of 26.  - Encouraged healthy diet and exercise

## 2024-01-01 NOTE — Assessment & Plan Note (Signed)
 Seems chronic and stable. TSH elevated at 8.924 - Patient likely need increase in her dose of Synthroid-I will defer that to PCP

## 2024-01-01 NOTE — Assessment & Plan Note (Signed)
 Clinically appears euvolemic and normal BNP.2D echo on 06/14/2023 showed EF> 55%.  Appears well compensated -Monitor volume status as she is getting some IV fluid

## 2024-01-01 NOTE — Assessment & Plan Note (Signed)
 TSH mildly elevated. - Continuing home Synthroid -Might need increase in the dose by PCP

## 2024-01-01 NOTE — Progress Notes (Signed)
 Notified X-RAY that contrast was given NGT clamped 1hour 11:05 NGT back on to INT suction. 12:15 X-Ray scheduled for 1900

## 2024-01-01 NOTE — Hospital Course (Addendum)
 Partly taken from H&P.  Madeline Green is a 73 y.o. female with medical history significant of partial colectomy due to diverticulitis with abdominal abscess,  HLD, hypothyroidism, dCHF, CKD-3A, remote DVT not on anticoagulants, whitecoat syndrome, vitamin B12 deficiency, Colo-vesicle fistula, chronic bradycardia, UTI, who presents with nausea, vomiting and abdominal pain for 3 days.  Last bowel movement was 3 days ago  Patient had 1 episode of desaturation to 85% which spontaneously improved without any oxygen.  Also had 1 episode of bradycardia with heart rate in 30s, history of chronic bradycardia.  On presentation otherwise stable vital, labs with leukocytosis at 10.3, respiratory panel negative, BNP 54, AKI with creatinine 1.47, UA with ketonuria and trace leukocytes, lipase normal at 38, T. bili 1.4 CT abdomen and pelvis with concern of SBO with transition point in the right lower quadrant.  Colonic diverticulosis without focal diverticulitis. Also incidental finding of right ovarian/adnexal cyst measuring 4 cm with recommendation to have a follow-up with pelvic ultrasound in 6 to 12 months.  NG tube was placed and general surgery was consulted.  KUB this morning with improved small bowel dilatation, no evidence of free intraperitoneal air.  10/5: Vital stable, some improvement in creatinine to 1.30, CBC normal.  Still no bowel function, Gastrografin studies were ordered by surgery.  10/6: Hemodynamically stable, passed Gastrografin challenge, started having bowel movements and passing flatus.  Abdominal pain resolved and tolerating advancement in diet.  Wants to go home.  Urine cultures with insignificant growth so no need to continue further antibiotics.  Patient need to discuss with primary care provider regarding repeating TSH in next couple of weeks and adjusting Synthroid dose accordingly.  She will also need to follow-up with gynecology for further evaluation of right ovarian  cyst.  She will continue on current medications and need to have a close follow-up with her providers for further assistance.

## 2024-01-01 NOTE — Progress Notes (Signed)
 Patient made a request to speak to the provider and make a complaint about the NGT not working properly. This nurse contacted the MD and notified him to reassure patient and family that the NGT is working properly. Patient is now happy to hear from the doctor that the suctioning is working properly.

## 2024-01-01 NOTE — Progress Notes (Signed)
 Progress Note   Patient: Madeline Green FMW:[id number] DOB: 25-Jan-1951 DOA: 12/31/2023     1 DOS: the patient was seen and examined on 01/01/2024   Brief hospital course: Partly taken from H&P.  MARIKAY ROADS is a 73 y.o. female with medical history significant of partial colectomy due to diverticulitis with abdominal abscess,  HLD, hypothyroidism, dCHF, CKD-3A, remote DVT not on anticoagulants, whitecoat syndrome, vitamin B12 deficiency, Colo-vesicle fistula, chronic bradycardia, UTI, who presents with nausea, vomiting and abdominal pain for 3 days.  Last bowel movement was 3 days ago  Patient had 1 episode of desaturation to 85% which spontaneously improved without any oxygen.  Also had 1 episode of bradycardia with heart rate in 30s, history of chronic bradycardia.  On presentation otherwise stable vital, labs with leukocytosis at 10.3, respiratory panel negative, BNP 54, AKI with creatinine 1.47, UA with ketonuria and trace leukocytes, lipase normal at 38, T. bili 1.4 CT abdomen and pelvis with concern of SBO with transition point in the right lower quadrant.  Colonic diverticulosis without focal diverticulitis. Also incidental finding of right ovarian/adnexal cyst measuring 4 cm with recommendation to have a follow-up with pelvic ultrasound in 6 to 12 months.  NG tube was placed and general surgery was consulted.  KUB this morning with improved small bowel dilatation, no evidence of free intraperitoneal air.  10/5: Vital stable, some improvement in creatinine to 1.30, CBC normal.  Still no bowel function, Gastrografin studies were ordered by surgery.   Assessment and Plan: * Small bowel obstruction (HCC) Imaging concerning for SBO with a transition point.  History of prior abdominal surgeries.  General surgery was consulted Gastrografin challenge was ordered today Might need laparotomy for concern of high-grade obstruction. - Continue with NG tube -Follow-up surgery  recommendations -Continue with supportive care  Bradycardia Seems chronic and stable. TSH elevated at 8.924 - Patient likely need increase in her dose of Synthroid-I will defer that to PCP  Chronic diastolic CHF (congestive heart failure) (HCC) Clinically appears euvolemic and normal BNP.2D echo on 06/14/2023 showed EF> 55%.  Appears well compensated -Monitor volume status as she is getting some IV fluid  Acute renal failure superimposed on stage 3a chronic kidney disease (HCC) Recent baseline creatinine 0.97.  Some improvement in creatinine to 1.40 -Continue with IV fluid -Monitor renal function -Avoid nephrotoxins  Acquired hypothyroidism TSH mildly elevated. - Continuing home Synthroid -Might need increase in the dose by PCP  HLD (hyperlipidemia) - Continue home Crestor once able to take p.o.  Ovarian cyst Incidental finding on CT scan.  Which showed right ovarian/adnexal cyst measuring 4 cm.  -pt has to follow-up pelvic ultrasound in 6-12 months.  UTI (urinary tract infection) Patient with concern of recent UTI with burning micturition and was prescribed ciprofloxacin , UA negative for bacteria, did show some leukocytes. - Urine culture ordered -Continue with ceftriaxone  Overweight (BMI 25.0-29.9) Mildly overweight with BMI of 26.  - Encouraged healthy diet and exercise   Subjective: Patient continued to have abdominal pain, no flatus or BM yet.  Still distended.  Physical Exam: Vitals:   01/01/24 0432 01/01/24 0448 01/01/24 0738 01/01/24 1446  BP: 105/65  110/65 115/67  Pulse: 75  71 78  Resp: 20  16 16   Temp: 98.6 F (37 C)   99.1 F (37.3 C)  TempSrc: Oral   Oral  SpO2: 95%  96% 93%  Weight:  76.4 kg    Height:       General.  Well-developed elderly  lady, in no acute distress. Pulmonary.  Lungs clear bilaterally, normal respiratory effort. CV.  Regular rate and rhythm, no JVD, rub or murmur. Abdomen.  Soft, mildly distended, mild diffuse tenderness,  bowel sounds hypoactive. CNS.  Alert and oriented .  No focal neurologic deficit. Extremities.  No edema, no cyanosis, pulses intact and symmetrical. Psychiatry.  Judgment and insight appears normal.   Data Reviewed: Prior data reviewed  Family Communication: Discussed with husband and daughter at bedside  Disposition: Status is: Inpatient Remains inpatient appropriate because:  severity of illness  Planned Discharge Destination: Home  DVT prophylaxis.  Subcu heparin Time spent: 50 minutes  This record has been created using Conservation officer, historic buildings. Errors have been sought and corrected,but may not always be located. Such creation errors do not reflect on the standard of care.   Author: Amaryllis Dare, MD 01/01/2024 3:33 PM  For on call review [email or web address].

## 2024-01-01 NOTE — Assessment & Plan Note (Signed)
 Recent baseline creatinine 0.97.  Some improvement in creatinine to 1.40 -Continue with IV fluid -Monitor renal function -Avoid nephrotoxins

## 2024-01-01 NOTE — Assessment & Plan Note (Addendum)
-   Continue home Crestor once able to take p.o.

## 2024-01-01 NOTE — Assessment & Plan Note (Signed)
 Patient with concern of recent UTI with burning micturition and was prescribed ciprofloxacin , UA negative for bacteria, did show some leukocytes. - Urine culture ordered -Continue with ceftriaxone

## 2024-01-01 NOTE — Consult Note (Signed)
 Patient ID: Madeline Green, female   DOB: 1951-02-04, 73 y.o.   MRN: [id number]  HPI Madeline Green is a 73 y.o. 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 Donnella at [facility] who is a very experienced retired Manufacturing engineer.  He told the family that it was one of the worst colectomies he has ever performing more than 26 years.  She had an initial Hartman's procedure for by colostomy reversal She now presents with acute onset of abdominal pain nausea and vomiting Patient states she has nausea, vomiting and abdominal pain in the past 3 days. She has multiple episodes of bilious and nonbloody vomiting each day.. Her abdominal pain is constant, cramping, moderate, nonradiating, not aggravated or alleviated by any known factors. Last bowel movement was 3 days ago.   Patient denies SOB, chest pain, cough.  No fever or chills. Patient has mild burning with urination and feeling pressure on urination.  No dysuria.   She did have a CT scan that I personally reviewed showing evidence of small bowel obstruction with a transition zone in the right lower quadrant.  No evidence of pneumatosis no evidence of free air.  No evidence of internal hernia. WBC 10.3, negative PCR for COVID, flu and RSV,  BNP 54.1,  NG w thick bilious output  HPI  Past Medical History:  Diagnosis Date   Acquired hypothyroidism    Allergy    Arthritis    Bradycardia    Chronic kidney disease, stage 3a (HCC)    History of blood clots    HLD (hyperlipidemia)    White coat syndrome without diagnosis of hypertension 02/08/2019    Past Surgical History:  Procedure Laterality Date   CATARACT EXTRACTION W/PHACO Left 12/02/2021   Procedure: CATARACT EXTRACTION PHACO AND INTRAOCULAR LENS PLACEMENT (IOC) LEFT 7.46 00:53.5;  Surgeon: Mittie Gaskin, MD;  Location: [facility];  Service: Ophthalmology;  Laterality: Left;    CATARACT EXTRACTION W/PHACO Right 01/13/2022   Procedure: CATARACT EXTRACTION PHACO AND INTRAOCULAR LENS PLACEMENT (IOC) RIGHT 11.01 01:17.7;  Surgeon: Mittie Gaskin, MD;  Location: [facility];  Service: Ophthalmology;  Laterality: Right;   CHOLECYSTECTOMY     COLONOSCOPY WITH PROPOFOL  N/A 04/22/2020   Procedure: COLONOSCOPY WITH PROPOFOL ;  Surgeon: Maryruth Ole DASEN, MD;  Location: [facility];  Service: Endoscopy;  Laterality: N/A;   Partial colectomy due to diverticulitis with abdominal abscess     VARICOSE VEIN SURGERY Left     Family History  Problem Relation Age of Onset   Alcohol abuse Mother    Hypertension Mother    Stroke Mother    Varicose Veins Mother    Alcohol abuse Father    Heart disease Father    Diabetes Father     Social History Social History   Tobacco Use   Smoking status: Never   Smokeless tobacco: Never  Vaping Use   Vaping status: Never Used  Substance Use Topics   Alcohol use: Yes    Alcohol/week: 10.0 standard drinks of alcohol    Types: 10 Glasses of wine per week   Drug use: No    Allergies  Allergen Reactions   Betadine [Povidone Iodine] Hives    Current Facility-Administered Medications  Medication Dose Route Frequency Provider Last Rate Last Admin   0.9 %  sodium chloride  infusion   Intravenous Continuous Niu, Xilin, MD 75 mL/hr at 12/31/23 2237 New Bag at 12/31/23 2237   acetaminophen (  TYLENOL) tablet 650 mg  650 mg Oral Q6H PRN Niu, Xilin, MD       cefTRIAXone (ROCEPHIN) 1 g in sodium chloride  0.9 % 100 mL IVPB  1 g Intravenous Q24H Niu, Xilin, MD 200 mL/hr at 01/01/24 0210 1 g at 01/01/24 0210   heparin injection 5,000 Units  5,000 Units Subcutaneous Q8H Niu, Xilin, MD   5,000 Units at 01/01/24 0515   levothyroxine (SYNTHROID) tablet 75 mcg  75 mcg Oral Q0600 Niu, Xilin, MD   75 mcg at 01/01/24 0515   morphine (PF) 2 MG/ML injection 2 mg  2 mg Intravenous Q4H PRN Niu, Xilin, MD       ondansetron  (ZOFRAN ) injection  4 mg  4 mg Intravenous Q8H PRN Niu, Xilin, MD       oxyCODONE-acetaminophen (PERCOCET/ROXICET) 5-325 MG per tablet 1 tablet  1 tablet Oral Q4H PRN Niu, Xilin, MD       rosuvastatin (CRESTOR) tablet 10 mg  10 mg Oral Daily Niu, Xilin, MD       zolpidem (AMBIEN) tablet 5 mg  5 mg Oral QHS PRN Niu, Xilin, MD   5 mg at 12/31/23 2237     Review of Systems Full ROS  was asked and was negative except for the information on the HPI  Physical Exam Blood pressure 110/65, pulse 71, temperature 98.6 F (37 C), temperature source Oral, resp. rate 16, height 5' 4 (1.626 m), weight 76.4 kg, SpO2 96%. CONSTITUTIONAL: non toxic. EYES: Pupils are equal, round, Sclera are non-icteric. EARS, NOSE, MOUTH AND THROAT: The oropharynx is clear. The oral mucosa is pink and moist. Hearing is intact to voice. LYMPH NODES:  Lymph nodes in the neck are normal. RESPIRATORY:  Lungs are clear. There is normal respiratory effort, with equal breath sounds bilaterally, and without pathologic use of accessory muscles. CARDIOVASCULAR: Heart is regular without murmurs, gallops, or rubs. GI: The abdomen is  soft,  mildly distended with mild diffuse tenderness w/o peritonitis or rebound.. There are no palpable masses. There is no hepatosplenomegaly. There are decreased  bowel sounds. GU: Rectal deferred.   MUSCULOSKELETAL: Normal muscle strength and tone. No cyanosis or edema.   SKIN: Turgor is good and there are no pathologic skin lesions or ulcers. NEUROLOGIC: Motor and sensation is grossly normal. Cranial nerves are grossly intact. PSYCH:  Oriented to person, place and time. Affect is normal.  Data Reviewed I have personally reviewed the patient's imaging, laboratory findings and medical records.    Assessment/Plan 73 year old very pleasant female with 2 prior abdominal operations now presents with small bowel obstruction. We have already started NG decompression and fluid resuscitation and serial abdominal exams.  I do  think that we we will do a Gastrografin challenge since I suspect she has a high-grade obstruction.  I am uncertain whether or not we will be able to manage this nonoperatively.  I do think the Gastrografin challenge will be able to provide more definitive answers.  She understands that he may require surgical intervention in the form of exploratory laparotomy.  I did explain to them in detail about what this entailed the risk the benefits and possible complications. They are very understanding I personally spent a total of 75 minutes in the care of the patient today including performing a medically appropriate exam/evaluation, counseling and educating, placing orders, referring and communicating with other health care professionals, documenting clinical information in the EHR, independently interpreting and reviewing images studies and coordinating care.    Laneta Luna, MD  FACS General Surgeon 01/01/2024, 9:16 AM

## 2024-01-02 DIAGNOSIS — K56609 Unspecified intestinal obstruction, unspecified as to partial versus complete obstruction: Secondary | ICD-10-CM | POA: Diagnosis not present

## 2024-01-02 LAB — BASIC METABOLIC PANEL WITH GFR
Anion gap: 12 (ref 5–15)
BUN: 20 mg/dL (ref 8–23)
CO2: 20 mmol/L — ABNORMAL LOW (ref 22–32)
Calcium: 7.3 mg/dL — ABNORMAL LOW (ref 8.9–10.3)
Chloride: 108 mmol/L (ref 98–111)
Creatinine, Ser: 0.98 mg/dL (ref 0.44–1.00)
GFR, Estimated: >60 mL/min (ref >60)
Glucose, Bld: 58 mg/dL — ABNORMAL LOW (ref 70–99)
Potassium: 3.7 mmol/L (ref 3.5–5.1)
Sodium: 140 mmol/L (ref 135–145)

## 2024-01-02 LAB — URINE CULTURE: Culture: <10000 — AB

## 2024-01-02 LAB — GLUCOSE, CAPILLARY
Glucose-Capillary: 61 mg/dL — ABNORMAL LOW (ref 70–99)
Glucose-Capillary: 191 mg/dL — ABNORMAL HIGH (ref 70–99)
Glucose-Capillary: 181 mg/dL — ABNORMAL HIGH (ref 70–99)

## 2024-01-02 LAB — PHOSPHORUS: Phosphorus: 2.5 mg/dL (ref 2.5–4.6)

## 2024-01-02 LAB — MAGNESIUM: Magnesium: 1.8 mg/dL (ref 1.7–2.4)

## 2024-01-02 MED ORDER — MENTHOL 3 MG MT LOZG: 1.0000 | LOZENGE | OROMUCOSAL | Status: DC | PRN

## 2024-01-02 MED ORDER — DEXTROSE 50 % IV SOLN
1.0000 | INTRAVENOUS | Status: DC | PRN
  Administered 2024-01-02: 50 mL via INTRAVENOUS
  Filled 2024-01-02: qty 50

## 2024-01-02 MED ORDER — DEXTROSE IN LACTATED RINGERS 5 % IV SOLN: INTRAVENOUS | Status: DC

## 2024-01-02 NOTE — Progress Notes (Signed)
 [facility] SURGICAL PROGRESS NOTE (cpt [phone])  Hospital Day(s): 2.   Interval History: Patient seen and examined, no acute events or new complaints overnight. Patient reports she is feeling much better. Her biggest issue is the NGT itself. No abdominal pain, nausea, emesis. Renal function normal with sCr - 0.98; UO - 360 ccs + unmeasured. No electrolyte derangements. NGT 800 ccs; only 100 ccs in past 12 hours. 8-hr KUB with gastrografin shows contrast in colon. She is passing flatus and had BM.   Review of Systems:  Constitutional: denies fever, chills  HEENT: denies cough or congestion  Respiratory: denies any shortness of breath  Cardiovascular: denies chest pain or palpitations  Gastrointestinal: denies abdominal pain, N/V Genitourinary: denies burning with urination or urinary frequency Musculoskeletal: denies pain, decreased motor or sensation  Vital signs in last 24 hours: [min-max] current  Temp:  [97.6 F (36.4 C)-99.1 F (37.3 C)] 98.1 F (36.7 C) (10/06 0324) Pulse Rate:  [61-78] 61 (10/06 0324) Resp:  [16] 16 (10/06 0324) BP: (110-122)/(56-72) 116/56 (10/06 0324) SpO2:  [93 %-97 %] 96 % (10/06 0324) Weight:  [75.5 kg] 75.5 kg (10/06 0328)     Height: 5' 4 (162.6 cm) Weight: 75.5 kg BMI (Calculated): 28.56   Intake/Output last 2 shifts:  10/05 0701 - 10/06 0700 In: 3310.9 [I.V.:3120.9; NG/GT:90; IV Piggyback:100] Out: 1260 [Urine:360; Emesis/NG output:900]   Physical Exam:  Constitutional: alert, cooperative and no distress  HENT: normocephalic without obvious abnormality; NGT in place (REMOVED) Eyes: PERRL, EOM's grossly intact and symmetric  Respiratory: breathing non-labored at rest  Cardiovascular: regular rate and sinus rhythm  Gastrointestinal: soft, non-tender, and non-distended, no rebound/guarding   Labs:     Latest Ref Rng & Units 01/01/2024    5:48 AM 12/31/2023    3:53 PM 12/13/2021    6:00 AM  CBC  WBC 4.0 - 10.5 K/uL 6.5  10.3   6.4   Hemoglobin 12.0 - 15.0 g/dL 86.0  83.7  86.4   Hematocrit 36.0 - 46.0 % 42.0  46.6  40.8   Platelets 150 - 400 K/uL 227  260  212       Latest Ref Rng & Units 01/02/2024    3:59 AM 01/01/2024    5:48 AM 12/31/2023    3:53 PM  CMP  Glucose 70 - 99 mg/dL 58  76  872   BUN 8 - 23 mg/dL 20  27  25    Creatinine 0.44 - 1.00 mg/dL 9.01  8.69  8.52   Sodium 135 - 145 mmol/L 140  136  133   Potassium 3.5 - 5.1 mmol/L 3.7  3.8  4.2   Chloride 98 - 111 mmol/L 108  100  96   CO2 22 - 32 mmol/L 20  28  21    Calcium 8.9 - 10.3 mg/dL 7.3  8.1  9.2   Total Protein 6.5 - 8.1 g/dL   6.9   Total Bilirubin 0.0 - 1.2 mg/dL   1.4   Alkaline Phos 38 - 126 U/L   53   AST 15 - 41 U/L   21   ALT 0 - 44 U/L   14      Imaging studies: No new pertinent imaging studies   Assessment/Plan: (ICD-10's: K101.609) 73 y.o. 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 I did encourage her to stay another 24 hours but she wishes to go home this afternoon. She understands this is quite fast, but we will see how diet goes this AM.    All of the above findings and recommendations were discussed with the patient, patient's family (husband at bedside), and the medical team, and all of patient's and family's questions were answered to their expressed satisfaction.  -- Arthea Platt, PA-C [facility] 01/02/2024, 7:15 AM M-F: 7am - 4pm

## 2024-01-02 NOTE — Care Management Important Message (Signed)
 Important Message  Patient Details  Name: Madeline Green MRN: [id number] Date of Birth: 1950-10-06   Important Message Given:  Yes - Medicare IM     [name] 01/02/2024, 2:26 PM

## 2024-01-02 NOTE — Discharge Summary (Signed)
Physician Discharge Summary   Patient: Madeline Green MRN: [id number] DOB: 1950/10/04  Admit date:     12/31/2023  Discharge date: 01/02/24  Discharge Physician: Amaryllis Dare   PCP: Madeline Reyes BIRCH, MD   Recommendations at discharge:  Please obtain CBC, BMP and TSH on follow-up Please adjust the Synthroid dose as appropriate Patient will get benefit from gynecology evaluation for incidental finding of ovarian cyst Follow-up with primary care provider  Discharge Diagnoses: Principal Problem:   Small bowel obstruction (HCC) Active Problems:   Bradycardia   Chronic diastolic CHF (congestive heart failure) (HCC)   Acute renal failure superimposed on stage 3a chronic kidney disease (HCC)   Acquired hypothyroidism   HLD (hyperlipidemia)   Ovarian cyst   UTI (urinary tract infection)   Overweight (BMI 25.0-29.9)   Hospital Course: Partly taken from H&P.  Madeline Green is a 73 y.o. female with medical history significant of partial colectomy due to diverticulitis with abdominal abscess,  HLD, hypothyroidism, dCHF, CKD-3A, remote DVT not on anticoagulants, whitecoat syndrome, vitamin B12 deficiency, Colo-vesicle fistula, chronic bradycardia, UTI, who presents with nausea, vomiting and abdominal pain for 3 days.  Last bowel movement was 3 days ago  Patient had 1 episode of desaturation to 85% which spontaneously improved without any oxygen.  Also had 1 episode of bradycardia with heart rate in 30s, history of chronic bradycardia.  On presentation otherwise stable vital, labs with leukocytosis at 10.3, respiratory panel negative, BNP 54, AKI with creatinine 1.47, UA with ketonuria and trace leukocytes, lipase normal at 38, T. bili 1.4 CT abdomen and pelvis with concern of SBO with transition point in the right lower quadrant.  Colonic diverticulosis without focal diverticulitis. Also incidental finding of right ovarian/adnexal cyst measuring 4 cm with recommendation to have a follow-up  with pelvic ultrasound in 6 to 12 months.  NG tube was placed and general surgery was consulted.  KUB this morning with improved small bowel dilatation, no evidence of free intraperitoneal air.  10/5: Vital stable, some improvement in creatinine to 1.30, CBC normal.  Still no bowel function, Gastrografin studies were ordered by surgery.  10/6: Hemodynamically stable, passed Gastrografin challenge, started having bowel movements and passing flatus.  Abdominal pain resolved and tolerating advancement in diet.  Wants to go home.  Urine cultures with insignificant growth so no need to continue further antibiotics.  Patient need to discuss with primary care provider regarding repeating TSH in next couple of weeks and adjusting Synthroid dose accordingly.  She will also need to follow-up with gynecology for further evaluation of right ovarian cyst.  She will continue on current medications and need to have a close follow-up with her providers for further assistance.  Assessment and Plan: * Small bowel obstruction (HCC) Imaging concerning for SBO with a transition point.  History of prior abdominal surgeries.  General surgery was consulted Patient passed Gastrografin challenge and started having BM and passing flatus.  Abdominal pain, nausea and vomiting resolved.  Patient was able to tolerate advancement in diet.  Bradycardia Seems chronic and stable. TSH elevated at 8.924 - Patient likely need increase in her dose of Synthroid-I will defer that to PCP  Chronic diastolic CHF (congestive heart failure) (HCC) Clinically appears euvolemic and normal BNP.2D echo on 06/14/2023 showed EF> 55%.  Appears well compensated  Acute renal failure superimposed on stage 3a chronic kidney disease (HCC) Recent baseline creatinine 0.97.  Some improvement in creatinine to 1.40>>0.98 with IV fluid  Acquired hypothyroidism TSH mildly elevated. -  Continuing home Synthroid -Might need increase in the dose by  PCP  HLD (hyperlipidemia) - Continue home Crestor once able to take p.o.  Ovarian cyst Incidental finding on CT scan.  Which showed right ovarian/adnexal cyst measuring 4 cm.  -pt has to follow-up pelvic ultrasound in 6-12 months.  UTI (urinary tract infection) Patient with concern of recent UTI with burning micturition and was prescribed ciprofloxacin , UA negative for bacteria, did show some leukocytes. Patient received ceftriaxone while in the hospital, urine cultures with insignificant growth so no need to continue antibiotics  Overweight (BMI 25.0-29.9) Mildly overweight with BMI of 26.  - Encouraged healthy diet and exercise  Consultants: General surgery Procedures performed: None Disposition: Home Diet recommendation:  Discharge Diet Orders (From admission, onward)     Start     Ordered   01/02/24 0000  Diet - low sodium heart healthy        01/02/24 1231           Regular diet DISCHARGE MEDICATION: Allergies as of 01/02/2024       Reactions   Betadine [povidone Iodine] Hives        Medication List     STOP taking these medications    doxycycline  100 MG capsule Commonly known as: MONODOX    nitrofurantoin (macrocrystal-monohydrate) 100 MG capsule Commonly known as: MACROBID       TAKE these medications    fluorouracil  5 % cream Commonly known as: EFUDEX  Apply thin coat topically to chest twice daily for 2 weeks   levothyroxine 75 MCG tablet Commonly known as: SYNTHROID Take 75 mcg by mouth daily before breakfast.   rosuvastatin 10 MG tablet Commonly known as: CRESTOR Take 10 mg by mouth daily.        Follow-up Information     Madeline Reyes BIRCH, MD. Schedule an appointment as soon as possible for a visit in 1 week(s).   Specialty: Internal Medicine Contact information: [address] [place] [address] [phone]                Discharge Exam: Madeline Green   12/31/23 1548 01/01/24 0448  01/02/24 0328  Weight: 70.3 kg 76.4 kg 75.5 kg   General.  Well-developed elderly lady, in no acute distress. Pulmonary.  Lungs clear bilaterally, normal respiratory effort. CV.  Regular rate and rhythm, no JVD, rub or murmur. Abdomen.  Soft, nontender, nondistended, BS positive. CNS.  Alert and oriented .  No focal neurologic deficit. Extremities.  No edema, no cyanosis, pulses intact and symmetrical. Psychiatry.  Judgment and insight appears normal.   Condition at discharge: stable  The results of significant diagnostics from this hospitalization (including imaging, microbiology, ancillary and laboratory) are listed below for reference.   Imaging Studies: DG Abd Portable 1V-Small Bowel Obstruction Protocol-initial, 8 hr delay Result Date: 01/01/2024 CLINICAL DATA:  Small bowel obstruction, 8 hour delay. EXAM: DG ABD PORTABLE 1V COMPARISON:  Radiographs earlier today.  CT yesterday FINDINGS: Tip and side port of the enteric tube below the diaphragm in the stomach. There is enteric contrast throughout the colon to the level of the distal sigmoid. Persistent gaseous small bowel distension up to 4.3 cm. Again seen IUD in the pelvis. IMPRESSION: Enteric contrast is seen throughout the colon. Persistent gaseous small bowel distension centrally. Findings may represent partial or resolving small bowel obstruction. Electronically Signed   By: Andrea Gasman M.D.   On: 01/01/2024 22:48   DG ABD ACUTE 2+V W 1V CHEST Result  Date: 01/01/2024 CLINICAL DATA:  Small-bowel obstruction. EXAM: DG ABDOMEN ACUTE WITH 1 VIEW CHEST COMPARISON:  CT abdomen pelvis 12/31/2023 FINDINGS: Upper chest film demonstrates normal heart size. There is no evidence of pulmonary edema, consolidation, pneumothorax or pleural fluid. Nasogastric tube extends into the stomach. Air-fluid levels present in small bowel. Small bowel dilatation likely improved since the prior CT. There is some air and stool in the proximal colon. No  evidence of free intraperitoneal air. IMPRESSION: Improved small bowel dilatation since the prior CT. No evidence of free intraperitoneal air. Electronically Signed   By: Marcey Moan M.D.   On: 01/01/2024 08:22   DG Abdomen 1 View Result Date: 12/31/2023 EXAM: 1 VIEW XRAY OF THE ABDOMEN 12/31/2023 08:30:00 PM COMPARISON: None available. CLINICAL HISTORY: NG placement. NG tube placement FINDINGS: BOWEL: Dilated small bowel loops in upper abdomen. Nonobstructive bowel gas pattern. SOFT TISSUES: Enteric tube in upper abdomen with tip in the region of the distal esophagus at the gastroesophageal junction. Enteric tube side port within the distal esophagus. BONES: No acute osseous abnormality. IMPRESSION: 1. Enteric tube tip and side-port in the region of the distal esophagus at the gastroesophageal junction. Advancement of the catheter by 15-20 cm is recommended. 2. Dilated small bowel loops in the upper abdomen, concerning for bowel obstruction. Electronically signed by: Dorethia Molt MD 12/31/2023 08:45 PM EDT RP Workstation: [id number]   CT ABDOMEN PELVIS W CONTRAST Result Date: 12/31/2023 CLINICAL DATA:  Diffuse abdominal pain worse in the lower abdomen, history of prior bowel resection from complicated diverticulitis, intractable vomiting for 3 days, no bowel movement --consider bowel obstruction, other pathology EXAM: CT ABDOMEN AND PELVIS WITH CONTRAST TECHNIQUE: Multidetector CT imaging of the abdomen and pelvis was performed using the standard protocol following bolus administration of intravenous contrast. RADIATION DOSE REDUCTION: This exam was performed according to the departmental dose-optimization program which includes automated exposure control, adjustment of the mA and/or kV according to patient size and/or use of iterative reconstruction technique. CONTRAST:  75mL OMNIPAQUE  IOHEXOL  300 MG/ML  SOLN COMPARISON:  CT 04/24/2013 FINDINGS: Lower chest: Subsegmental atelectasis in the dependent  lower lobes. No pleural effusion. Hepatobiliary: Normal appearance of the liver. Contracted gallbladder. No calcified gallstone. No biliary dilatation. Pancreas: No ductal dilatation or inflammation. Spleen: Normal in size without focal abnormality. Adrenals/Urinary Tract: No hydronephrosis, renal calculi, or renal inflammation. Minimally distended urinary bladder, mild bladder wall thickening Stomach/Bowel: Dilated fluid-filled small bowel. Fecalization of small bowel contents just proximal to a transition point in the right lower quadrant, series 2, image 52. There is mesenteric edema and free fluid. No pneumatosis or perforation. The distal small bowel is decompressed. Enteric sutures noted within bowel in the right abdomen. Normal appendix courses adjacent to the liver. Colonic diverticulosis without focal diverticulitis. Enteric sutures are noted in the sigmoid. Small duodenal diverticulum. Vascular/Lymphatic: Aortic atherosclerosis. No aortic aneurysm. No enlarged lymph nodes in the abdomen or pelvis. No portal venous or mesenteric gas. Reproductive: IUD in the uterus. Probable nabothian cyst in the cervix. 4 cm simple appearing right adnexal cyst. Other: Mesenteric edema. Small amount of free fluid in the right upper quadrant, mesentery, right pericolic gutter and pelvis. No free air. No abdominal wall hernia. Musculoskeletal: Multilevel degenerative change in the spine. There are no acute or suspicious osseous abnormalities. IMPRESSION: 1. Small bowel obstruction with transition point in the right lower quadrant. Mesenteric edema and free fluid. 2. Colonic diverticulosis without focal diverticulitis. 3. Mild bladder wall thickening, can be seen with cystitis. 4. Right  ovarian/adnexal cyst measuring 4 cm. Follow-up pelvic ultrasound is recommended in 6-12 months. Aortic Atherosclerosis (ICD10-I70.0). Electronically Signed   By: Andrea Gasman M.D.   On: 12/31/2023 19:51   DG Chest 2 View Result Date:  12/31/2023 CLINICAL DATA:  Low heart rate. EXAM: CHEST - 2 VIEW COMPARISON:  12/13/2021. FINDINGS: The heart size and mediastinal contours are within normal limits. Minimal subsegmental atelectasis or scarring is noted at the left lung base. No consolidation, effusion, or pneumothorax. No acute osseous abnormality. IMPRESSION: No active cardiopulmonary disease. Electronically Signed   By: Leita Birmingham M.D.   On: 12/31/2023 16:30   CT Angio Chest Pulmonary Embolism (PE) W or WO Contrast Result Date: 12/06/2023 CLINICAL DATA:  Elevated D-dimer left-sided chest pressure EXAM: CT ANGIOGRAPHY CHEST WITH CONTRAST TECHNIQUE: Multidetector CT imaging of the chest was performed using the standard protocol during bolus administration of intravenous contrast. Multiplanar CT image reconstructions and MIPs were obtained to evaluate the vascular anatomy. RADIATION DOSE REDUCTION: This exam was performed according to the departmental dose-optimization program which includes automated exposure control, adjustment of the mA and/or kV according to patient size and/or use of iterative reconstruction technique. CONTRAST:  75mL OMNIPAQUE  IOHEXOL  350 MG/ML SOLN COMPARISON:  Chest x-ray 12/13/2021, CT chest 01/25/2019 FINDINGS: Cardiovascular: Satisfactory opacification of the pulmonary arteries to the segmental level. No evidence of pulmonary embolism. Borderline cardiomegaly. No pericardial effusion. Nonaneurysmal aorta. No dissection. Mild atherosclerosis. Mediastinum/Nodes: No enlarged mediastinal, hilar, or axillary lymph nodes. Thyroid  gland, trachea, and esophagus demonstrate no significant findings. Lungs/Pleura: No acute airspace disease, pleural effusion, or pneumothorax. Scarring or atelectasis at the lingula and left base. Upper Abdomen: No acute finding Musculoskeletal: No acute osseous abnormality Review of the MIP images confirms the above findings. IMPRESSION: 1. Negative for acute pulmonary embolus or aortic  dissection. 2. Borderline cardiomegaly. 3. Aortic atherosclerosis. Aortic Atherosclerosis (ICD10-I70.0). Electronically Signed   By: Luke Bun M.D.   On: 12/06/2023 15:06    Microbiology: Results for orders placed or performed during the hospital encounter of 12/31/23  Urine Culture (for pregnant, neutropenic or urologic patients or patients with an indwelling urinary catheter)     Status: Abnormal   Collection Time: 12/31/23  3:53 PM   Specimen: Urine, Clean Catch  Result Value Ref Range Status   Specimen Description   Final    URINE, CLEAN CATCH Performed at [facility], [address], [place], [place] [address]    Special Requests   Final    NONE Performed at [facility], [address], [place], [place] [address]    Culture (A)  Final    <10,000 COLONIES/mL INSIGNIFICANT GROWTH Performed at [facility], [address] [place], [place] [address]    Report Status 01/02/2024 FINAL  Final  Resp panel by RT-PCR (RSV, Flu A&B, Covid) Anterior Nasal Swab     Status: None   Collection Time: 12/31/23  6:17 PM   Specimen: Anterior Nasal Swab  Result Value Ref Range Status   SARS Coronavirus 2 by RT PCR NEGATIVE NEGATIVE Final    Comment: (NOTE) SARS-CoV-2 target nucleic acids are NOT DETECTED.  The SARS-CoV-2 RNA is generally detectable in upper respiratory specimens during the acute phase of infection. The lowest concentration of SARS-CoV-2 viral copies this assay can detect is 138 copies/mL. A negative result does not preclude SARS-Cov-2 infection and should not be used as the sole basis for treatment or other patient management decisions. A negative result may occur with  improper  specimen collection/handling, submission of specimen other than nasopharyngeal swab, presence of viral mutation(s) within the areas targeted by this assay, and inadequate number of viral copies(<138 copies/mL). A negative result must be combined with clinical  observations, patient history, and epidemiological information. The expected result is Negative.  Fact Sheet for Patients:  BloggerCourse.com  Fact Sheet for Healthcare Providers:  SeriousBroker.it  This test is no t yet approved or cleared by the [place]  FDA and  has been authorized for detection and/or diagnosis of SARS-CoV-2 by FDA under an Emergency Use Authorization (EUA). This EUA will remain  in effect (meaning this test can be used) for the duration of the COVID-19 declaration under Section 564(b)(1) of the Act, 21 U.S.C.section 360bbb-3(b)(1), unless the authorization is terminated  or revoked sooner.       Influenza A by PCR NEGATIVE NEGATIVE Final   Influenza B by PCR NEGATIVE NEGATIVE Final    Comment: (NOTE) The Xpert Xpress SARS-CoV-2/FLU/RSV plus assay is intended as an aid in the diagnosis of influenza from Nasopharyngeal swab specimens and should not be used as a sole basis for treatment. Nasal washings and aspirates are unacceptable for Xpert Xpress SARS-CoV-2/FLU/RSV testing.  Fact Sheet for Patients: BloggerCourse.com  Fact Sheet for Healthcare Providers: SeriousBroker.it  This test is not yet approved or cleared by the [place]  FDA and has been authorized for detection and/or diagnosis of SARS-CoV-2 by FDA under an Emergency Use Authorization (EUA). This EUA will remain in effect (meaning this test can be used) for the duration of the COVID-19 declaration under Section 564(b)(1) of the Act, 21 U.S.C. section 360bbb-3(b)(1), unless the authorization is terminated or revoked.     Resp Syncytial Virus by PCR NEGATIVE NEGATIVE Final    Comment: (NOTE) Fact Sheet for Patients: BloggerCourse.com  Fact Sheet for Healthcare Providers: SeriousBroker.it  This test is not yet approved or cleared by  the [place]  FDA and has been authorized for detection and/or diagnosis of SARS-CoV-2 by FDA under an Emergency Use Authorization (EUA). This EUA will remain in effect (meaning this test can be used) for the duration of the COVID-19 declaration under Section 564(b)(1) of the Act, 21 U.S.C. section 360bbb-3(b)(1), unless the authorization is terminated or revoked.  Performed at [facility], [address] [place], [place] [address]     Labs: CBC: Recent Labs  Lab 12/31/23 1553 01/01/24 0548  WBC 10.3 6.5  HGB 16.2* 13.9  HCT 46.6* 42.0  MCV 91.4 94.0  PLT 260 227   Basic Metabolic Panel: Recent Labs  Lab 12/31/23 1553 01/01/24 0548 01/02/24 0359  NA 133* 136 140  K 4.2 3.8 3.7  CL 96* 100 108  CO2 21* 28 20*  GLUCOSE 127* 76 58*  BUN 25* 27* 20  CREATININE 1.47* 1.30* 0.98  CALCIUM 9.2 8.1* 7.3*  MG  --   --  1.8  PHOS  --   --  2.5   Liver Function Tests: Recent Labs  Lab 12/31/23 1553  AST 21  ALT 14  ALKPHOS 53  BILITOT 1.4*  PROT 6.9  ALBUMIN 3.2*   CBG: Recent Labs  Lab 01/02/24 0540 01/02/24 0630 01/02/24 0803  GLUCAP 61* 191* 181*    Discharge time spent: greater than 30 minutes.  This record has been created using Conservation officer, historic buildings. Errors have been sought and corrected,but may not always be located. Such creation errors do not reflect on the standard of care.   Signed: Amaryllis Dare, MD [facility]  01/02/2024 

## 2024-01-02 NOTE — TOC CM/SW Note (Signed)
 Transition of Care [facility] CM/SW Note    Transition of Care [facility]) - Inpatient Brief Assessment   Patient Details  Name: Madeline Green MRN: [id number] Date of Birth: 1950/07/05  Transition of Care [facility]) CM/SW Contact:    Alfonso Rummer, LCSW Phone Number: 01/02/2024, 11:52 AM   Clinical Narrative:  [name] Rummer completed TOC chart review. No TOC needs identified please contact should needs arise.   Transition of Care Asessment:

## 2024-01-02 NOTE — Progress Notes (Signed)
 Mobility Specialist Progress Note:    01/02/24 1026  Mobility  Activity Ambulated independently  Level of Assistance Independent after set-up  Assistive Device None  Distance Ambulated (ft) 500 ft  Range of Motion/Exercises Active;All extremities  Activity Response Tolerated well  Mobility visit 1 Mobility  Mobility Specialist Start Time (ACUTE ONLY) 1013  Mobility Specialist Stop Time (ACUTE ONLY) 1026  Mobility Specialist Time Calculation (min) (ACUTE ONLY) 13 min   Pt received in bed, husband at bedside. Eager for mobility, independently able to stand and ambulate >513ft with no AD. Tolerated well, asx throughout. Returned supine, all needs met.  Sherrilee Ditty Mobility Specialist Please contact via Special educational needs teacher or  Rehab office at [phone]

## 2024-01-05 ENCOUNTER — Ambulatory Visit: Admitting: Dermatology

## 2024-01-06 DIAGNOSIS — I498 Other specified cardiac arrhythmias: Secondary | ICD-10-CM | POA: Diagnosis not present

## 2024-01-06 DIAGNOSIS — I493 Ventricular premature depolarization: Secondary | ICD-10-CM | POA: Diagnosis not present

## 2024-01-06 DIAGNOSIS — E782 Mixed hyperlipidemia: Secondary | ICD-10-CM | POA: Diagnosis not present

## 2024-01-06 DIAGNOSIS — R001 Bradycardia, unspecified: Secondary | ICD-10-CM | POA: Diagnosis not present

## 2024-02-27 DIAGNOSIS — Z79899 Other long term (current) drug therapy: Secondary | ICD-10-CM | POA: Diagnosis not present

## 2024-02-27 DIAGNOSIS — E782 Mixed hyperlipidemia: Secondary | ICD-10-CM | POA: Diagnosis not present

## 2024-08-09 ENCOUNTER — Ambulatory Visit: Admitting: Dermatology
